# Patient Record
Sex: Male | Born: 1970 | Race: Black or African American | Hispanic: No | Marital: Single | Smoking: Never smoker
Health system: Southern US, Community
[De-identification: ages and names within clinical notes are randomized; demographics above are authoritative.]

## PROBLEM LIST (undated history)

## (undated) DIAGNOSIS — I10 Essential (primary) hypertension: Secondary | ICD-10-CM

## (undated) DIAGNOSIS — R7303 Prediabetes: Secondary | ICD-10-CM

## (undated) DIAGNOSIS — E785 Hyperlipidemia, unspecified: Secondary | ICD-10-CM

## (undated) DIAGNOSIS — E119 Type 2 diabetes mellitus without complications: Secondary | ICD-10-CM

## (undated) DIAGNOSIS — G709 Myoneural disorder, unspecified: Secondary | ICD-10-CM

## (undated) HISTORY — PX: WISDOM TOOTH EXTRACTION: SHX21

## (undated) HISTORY — DX: Essential (primary) hypertension: I10

## (undated) HISTORY — DX: Hyperlipidemia, unspecified: E78.5

## (undated) HISTORY — DX: Myoneural disorder, unspecified: G70.9

## (undated) HISTORY — DX: Type 2 diabetes mellitus without complications: E11.9

---

## 1997-07-13 ENCOUNTER — Emergency Department (HOSPITAL_COMMUNITY): Admission: EM | Admit: 1997-07-13 | Discharge: 1997-07-13 | Payer: Self-pay | Admitting: Emergency Medicine

## 2005-01-04 ENCOUNTER — Emergency Department (HOSPITAL_COMMUNITY): Admission: EM | Admit: 2005-01-04 | Discharge: 2005-01-04 | Payer: Self-pay | Admitting: Emergency Medicine

## 2006-05-26 ENCOUNTER — Emergency Department (HOSPITAL_COMMUNITY): Admission: EM | Admit: 2006-05-26 | Discharge: 2006-05-26 | Payer: Self-pay | Admitting: Emergency Medicine

## 2006-12-13 ENCOUNTER — Emergency Department (HOSPITAL_COMMUNITY): Admission: EM | Admit: 2006-12-13 | Discharge: 2006-12-13 | Payer: Self-pay | Admitting: Emergency Medicine

## 2007-10-03 ENCOUNTER — Emergency Department (HOSPITAL_COMMUNITY): Admission: EM | Admit: 2007-10-03 | Discharge: 2007-10-04 | Payer: Self-pay | Admitting: Emergency Medicine

## 2007-10-10 ENCOUNTER — Ambulatory Visit: Payer: Self-pay | Admitting: Nurse Practitioner

## 2007-10-10 DIAGNOSIS — I10 Essential (primary) hypertension: Secondary | ICD-10-CM | POA: Insufficient documentation

## 2007-10-15 ENCOUNTER — Encounter (INDEPENDENT_AMBULATORY_CARE_PROVIDER_SITE_OTHER): Payer: Self-pay | Admitting: Nurse Practitioner

## 2007-10-15 DIAGNOSIS — E7800 Pure hypercholesterolemia, unspecified: Secondary | ICD-10-CM | POA: Insufficient documentation

## 2007-10-24 ENCOUNTER — Ambulatory Visit: Payer: Self-pay | Admitting: Nurse Practitioner

## 2007-12-25 ENCOUNTER — Ambulatory Visit: Payer: Self-pay | Admitting: Nurse Practitioner

## 2007-12-25 DIAGNOSIS — R519 Headache, unspecified: Secondary | ICD-10-CM | POA: Insufficient documentation

## 2007-12-25 DIAGNOSIS — R0989 Other specified symptoms and signs involving the circulatory and respiratory systems: Secondary | ICD-10-CM

## 2007-12-25 DIAGNOSIS — R51 Headache: Secondary | ICD-10-CM | POA: Insufficient documentation

## 2007-12-25 DIAGNOSIS — R0609 Other forms of dyspnea: Secondary | ICD-10-CM | POA: Insufficient documentation

## 2008-02-03 ENCOUNTER — Encounter (INDEPENDENT_AMBULATORY_CARE_PROVIDER_SITE_OTHER): Payer: Self-pay | Admitting: Nurse Practitioner

## 2010-02-05 LAB — CONVERTED CEMR LAB
ALT: 40 units/L (ref 0–53)
AST: 29 units/L (ref 0–37)
Albumin: 4.4 g/dL (ref 3.5–5.2)
Alkaline Phosphatase: 62 units/L (ref 39–117)
BUN: 13 mg/dL (ref 6–23)
Basophils Absolute: 0 10*3/uL (ref 0.0–0.1)
Basophils Relative: 0 % (ref 0–1)
Bilirubin Urine: NEGATIVE
Blood in Urine, dipstick: NEGATIVE
CO2: 21 meq/L (ref 19–32)
Calcium: 9.6 mg/dL (ref 8.4–10.5)
Chloride: 107 meq/L (ref 96–112)
Cholesterol: 198 mg/dL (ref 0–200)
Creatinine, Ser: 1.08 mg/dL (ref 0.40–1.50)
Eosinophils Absolute: 0.1 10*3/uL (ref 0.0–0.7)
Eosinophils Relative: 3 % (ref 0–5)
Glucose, Bld: 92 mg/dL (ref 70–99)
Glucose, Urine, Semiquant: NEGATIVE
HCT: 49.2 % (ref 39.0–52.0)
HDL: 45 mg/dL (ref >39)
Hemoglobin: 15.5 g/dL (ref 13.0–17.0)
LDL Cholesterol: 115 mg/dL — ABNORMAL HIGH (ref 0–99)
Lymphocytes Relative: 31 % (ref 12–46)
Lymphs Abs: 1.6 10*3/uL (ref 0.7–4.0)
MCHC: 31.5 g/dL (ref 30.0–36.0)
MCV: 88.5 fL (ref 78.0–100.0)
Microalb, Ur: 0.51 mg/dL (ref 0.00–1.89)
Monocytes Absolute: 0.5 10*3/uL (ref 0.1–1.0)
Monocytes Relative: 9 % (ref 3–12)
Neutro Abs: 3.1 10*3/uL (ref 1.7–7.7)
Neutrophils Relative %: 58 % (ref 43–77)
Nitrite: NEGATIVE
Platelets: 248 10*3/uL (ref 150–400)
Potassium: 4.6 meq/L (ref 3.5–5.3)
Protein, U semiquant: NEGATIVE
RBC: 5.56 M/uL (ref 4.22–5.81)
RDW: 14.9 % (ref 11.5–15.5)
Sodium: 139 meq/L (ref 135–145)
Specific Gravity, Urine: 1.025
TSH: 1.218 microintl units/mL (ref 0.350–4.50)
Total Bilirubin: 0.6 mg/dL (ref 0.3–1.2)
Total CHOL/HDL Ratio: 4.4
Total Protein: 7.6 g/dL (ref 6.0–8.3)
Triglycerides: 191 mg/dL — ABNORMAL HIGH (ref <150)
Urobilinogen, UA: 0.2
VLDL: 38 mg/dL (ref 0–40)
WBC Urine, dipstick: NEGATIVE
WBC: 5.3 10*3/uL (ref 4.0–10.5)
pH: 5

## 2010-02-07 NOTE — Assessment & Plan Note (Signed)
Summary: HTN/Headache   Vital Signs:  Patient Profile:   40 Years Old Male Height:     71.65 inches (182 cm) Weight:      238 pounds BMI:     32.71 BSA:     2.29 Temp:     97.1 degrees F oral Pulse rate:   78 / minute Pulse rhythm:   regular Resp:     18 per minute BP sitting:   130 / 90  (right arm) Cuff size:   large  Pt. in pain?   no  Vitals Entered By: [place] Shannon MA              Is Patient Diabetic? No     Chief Complaint:  pt stated that he feels if his med is not working...pt says he is still having headaches and he has tried to change his diet and he is also exercising now...  History of Present Illness:  Pt into the office for follow up. Pt reports that he has daily headache. He was started on blood pressure medications back in 10/09. he is taking the blood pressure meds. +headache (upon waking at times) +snoring Exercise routine is lacking some.  He has not exercised in about 1 month. Drinks plenty of water.  Pt takes Tylenol ES or either Excedrin Migraine as needed for headache.  Usually takes tylenol ES.  Social - pt will start school in January of [employer]. He wants a career change.  labs reviewed from previous visit     Prior Medications Reviewed Using: Patient Recall  Current Allergies (reviewed today): No known allergies     Risk Factors: Tobacco use:  never Alcohol use:  yes    Type:  beer Exercise:  yes    Times per week:  3    Type:  walking Seatbelt use:  75 %   Review of Systems  Resp      Denies cough.  GI      Denies abdominal pain, nausea, and vomiting.  GU      Denies discharge.  Neuro      Complains of headaches.   Physical Exam  General:     alert.   Head:     normocephalic.  bald head Mouth:     fair dentation Lungs:     normal breath sounds.   Heart:     normal rate and regular rhythm.   Abdomen:     soft and non-tender.   Msk:     normal ROM.   Neurologic:     alert & oriented X3.     Psych:     Oriented X3.      Impression & Recommendations:  Problem # 1:  HYPERTENSION, BENIGN ESSENTIAL (ICD-401.1) will increase blood pressure meds.  His updated medication list for this problem includes:    Zestoretic 20-25 Mg Tabs (Lisinopril-hydrochlorothiazide) .Marland Kitchen... 1 tablet by mouth daily for blood pressure  Orders: Split Night (Split Night)   Problem # 2:  HEADACHE (ICD-784.0) will order sleep study. Orders: Split Night (Split Night)   Complete Medication List: 1)  Zestoretic 20-25 Mg Tabs (Lisinopril-hydrochlorothiazide) .Marland Kitchen.. 1 tablet by mouth daily for blood pressure   Patient Instructions: 1)  You will need to increase the blood pressure medications.  You will get need to get the new prescription.   2)  Sleep study will be done to see if you have sleep apnea. 3)  Follow up in 2-3 weeks for blood pressure check.  Prescriptions: ZESTORETIC 20-25 MG TABS (LISINOPRIL-HYDROCHLOROTHIAZIDE) 1 tablet by mouth daily for blood pressure  #30 x 3   Entered and Authorized by:   Lehman Prom FNP   Signed by:   Lehman Prom FNP on 12/25/2007   Method used:   Printed then faxed to ...       [facility] [address] [phone]* (retail)       [address]       [place], [place]  [address]       Ph: [phone]       Fax: [phone]   RxID:   [id number] ZESTORETIC 20-25 MG TABS (LISINOPRIL-HYDROCHLOROTHIAZIDE) 1 tablet by mouth daily for blood pressure  #30 x 3   Entered and Authorized by:   Lehman Prom FNP   Signed by:   Lehman Prom FNP on 12/25/2007   Method used:   Printed then faxed to ...       [facility] [address] [phone]* (retail)       [address]       [place], [place]  [address]       Ph: [phone]       Fax: [phone]   RxID:   [id number]  ]

## 2010-02-07 NOTE — Letter (Signed)
Summary: Lipid Letter  [facility]  [address] [place], [place] [address]   Phone: [phone]  Fax: [phone]    10/15/2007  [facility] [address] [place], [place]  [address]  Dear Henry Young:  We have carefully reviewed your last lipid profile from 10/10/2007 and the results are noted below with a summary of recommendations for lipid management.    Cholesterol:       198     Goal: less than 200   HDL "good" Cholesterol:   45     Goal: greater than 40   LDL "bad" Cholesterol:   115     Goal: less than 100   Triglycerides:       191     Goal: less than 150  Your cholesterol was slightly elevated during recent labs.  See attached handout on ways to modify your diet to improve cholesterol.  You will need your cholesterol re-checked in 3 months.    Adjunctive Measures (may lower LIPIDS and reduce risk of Heart Attack) include: Aerobic Exercise (20-30 minutes 3-4 times a week) Limit Alcohol Consumption Weight Reduction Aspirin 81 mg a day by mouth (if not allergic or contraindicated)     Current Medications: 1)    Lisinopril-hydrochlorothiazide 10-12.5 Mg Tabs (Lisinopril-hydrochlorothiazide) .Marland Kitchen.. 1 tablet by mouth for blood pressure  If you have any questions, please call. We appreciate being able to work with you.   Sincerely,    [facility]

## 2010-02-07 NOTE — Letter (Signed)
Summary: Handout Printed  Printed Handout:  - Diet - Low-Fat, Low-Saturated-Fat, Low-Cholesterol Diets 

## 2010-02-07 NOTE — Assessment & Plan Note (Signed)
Summary: Blood Pressure Check  Nurse Visit   Impression & Recommendations:  Problem # 1:  HYPERTENSION, BENIGN ESSENTIAL (ICD-401.1) Continue medications at current dose. He needs to take meds daily His updated medication list for this problem includes:    Lisinopril-hydrochlorothiazide 10-12.5 Mg Tabs (Lisinopril-hydrochlorothiazide) .Marland Kitchen... 1 tablet by mouth for blood pressure   Complete Medication List: 1)  Lisinopril-hydrochlorothiazide 10-12.5 Mg Tabs (Lisinopril-hydrochlorothiazide) .Marland Kitchen.. 1 tablet by mouth for blood pressure   Vital Signs:  Patient Profile:   40 Years Old Male CC:      BP chek Height:     71.65 inches (182 cm) BP sitting:   130 / 92  (left arm) Cuff size:   large  Vitals Entered By: Levon Hedger (October 24, 2007 2:17 PM)             Comments pt states he is not taking his medications everyday.  He has been having headaches alot.  Per Zena Amos he will continue medication at present dose and to take his medications daily and at the same time.  The headaches come from his pressure fluctuating up and down.     Prior Medications: LISINOPRIL-HYDROCHLOROTHIAZIDE 10-12.5 MG TABS (LISINOPRIL-HYDROCHLOROTHIAZIDE) 1 tablet by mouth for blood pressure Current Allergies: No known allergies     Orders Added: 1)  Est. Patient Level I [email or web address]    ]

## 2010-02-07 NOTE — Assessment & Plan Note (Signed)
Summary: NEW - HTN   Vital Signs:  Patient Profile:   40 Years Old Male Height:     71.65 inches (182 cm) Weight:      238 pounds Temp:     98.4 degrees F oral Pulse rate:   80 / minute Pulse rhythm:   regular Resp:     20 per minute BP sitting:   150 / 110  (left arm) Cuff size:   large  Pt. in pain?   no              Is Patient Diabetic? No     Chief Complaint:  pt is having headache and he thought his bp may be up or cholesterol.  History of Present Illness:  Pt into the office to establish care.  No PMH No PSH FH - mother  Pt started having headaches about 2 weeks ago. He went to [facility] and his BP was elevated.  He then went to the ER. He was told his blood pressure was elevated but he did not recieve any BP meds.    Pt does eat out all the time.    Hypertension History:      He complains of headache, but denies chest pain and palpitations.  He notes no problems with any antihypertensive medication side effects.        Positive major cardiovascular risk factors include hypertension.  Negative major cardiovascular risk factors include male age less than 85 years old and non-tobacco-user status.       Current Allergies: No known allergies    Family History:    htn - mother   Risk Factors:  Tobacco use:  never Alcohol use:  yes    Type:  beer Exercise:  yes    Times per week:  3    Type:  walking Seatbelt use:  75 %   Review of Systems  CV      Denies chest pain or discomfort.  Resp      Denies cough.  GI      Denies abdominal pain, nausea, and vomiting.  Neuro      Complains of headaches.   Serial Vital Signs/Assessments:  Time      Position  BP       Pulse  Resp  Temp     By                     128/90                         Lehman Prom FNP   Physical Exam  General:     alert.   Head:     normocephalic.   Eyes:     pupils round.   Lungs:     normal breath sounds.   Heart:     normal rate and regular rhythm.     Abdomen:     soft, non-tender, and normal bowel sounds.   Msk:     up to exam table no limits Neurologic:     alert & oriented X3.   Psych:     Oriented X3.      Impression & Recommendations:  Problem # 1:  HYPERTENSION, BENIGN ESSENTIAL (ICD-401.1) new onset need to start meds will check urine for micoralbumin handout given DASH diet His updated medication list for this problem includes:    Lisinopril-hydrochlorothiazide 10-12.5 Mg Tabs (Lisinopril-hydrochlorothiazide) .Marland Kitchen... 1 tablet by mouth for blood pressure  Orders: UA Dipstick w/o Micro (manual) (04540) T-General Health Panel (CBCD, CMP, TSH) ([address]) T-Lipid Profile ([address]562-13086) T-Urine Microalbumin w/creat. ratio (57846 / [address]) EKG w/ Interpretation (93000)   Complete Medication List: 1)  Lisinopril-hydrochlorothiazide 10-12.5 Mg Tabs (Lisinopril-hydrochlorothiazide) .Marland Kitchen.. 1 tablet by mouth for blood pressure  Hypertension Assessment/Plan:      The patient's hypertensive risk group is category A: No risk factors and no target organ damage.  Today's blood pressure is 150/110.  His blood pressure goal is < 140/90.   Patient Instructions: 1)  You will need to get scheduled for the flu vaccine.  You are indicated due to high blood pressure. 2)  Start blood pressure medications and take daily. 3)  You will be notified of any abnormal labs. 4)  You will need to come back for blood pressure check in 2 weeks (nurse visit)   Prescriptions: LISINOPRIL-HYDROCHLOROTHIAZIDE 10-12.5 MG TABS (LISINOPRIL-HYDROCHLOROTHIAZIDE) 1 tablet by mouth for blood pressure  #30 x 1   Entered and Authorized by:   Lehman Prom FNP   Signed by:   Lehman Prom FNP on 10/10/2007   Method used:   Print then Give to Patient   RxID:   [id number]  ] Laboratory Results   Urine Tests  Date/Time Recieved: October 10, 2007 12:01 PM   Routine Urinalysis   Color: lt. yellow Appearance: Clear Glucose: negative    (Normal Range: Negative) Bilirubin: negative   (Normal Range: Negative) Ketone: trace (5)   (Normal Range: Negative) Spec. Gravity: 1.025   (Normal Range: 1.003-1.035) Blood: negative   (Normal Range: Negative) pH: 5.0   (Normal Range: 5.0-8.0) Protein: negative   (Normal Range: Negative) Urobilinogen: 0.2   (Normal Range: 0-1) Nitrite: negative   (Normal Range: Negative) Leukocyte Esterace: negative   (Normal Range: Negative)        Appended Document: NEW - HTN        Current Allergies: No known allergies         Complete Medication List: 1)  Lisinopril-hydrochlorothiazide 10-12.5 Mg Tabs (Lisinopril-hydrochlorothiazide) .Marland Kitchen.. 1 tablet by mouth for blood pressure    ]  Medication Administration  Medication # 1:    Medication: Clonidine 0.1mg  tab    Diagnosis: HYPERTENSION, BENIGN ESSENTIAL (ICD-401.1)    Dose: 1 tablet    Route: po    Exp Date: 08/2008    Lot #: [id number]    Mfr: Stacie [place]    Comments: ndc [id number]    Patient tolerated medication without complications    Given by: Levon Hedger (October 10, 2007 12:46 PM)  Orders Added: 1)  Clonidine 0.1mg  tab [EMRORAL]

## 2011-04-12 ENCOUNTER — Ambulatory Visit: Payer: Self-pay | Admitting: Family Medicine

## 2011-04-12 VITALS — BP 137/84 | HR 108 | Temp 98.3°F | Resp 16 | Ht 72.0 in | Wt 232.0 lb

## 2011-04-12 DIAGNOSIS — R51 Headache: Secondary | ICD-10-CM

## 2011-04-12 MED ORDER — METOPROLOL SUCCINATE ER 50 MG PO TB24: 50.0000 mg | ORAL_TABLET | Freq: Every day | ORAL | Status: DC

## 2011-04-12 MED ORDER — KETOROLAC TROMETHAMINE 60 MG/2ML IM SOLN
60.0000 mg | Freq: Once | INTRAMUSCULAR | Status: AC
  Administered 2011-04-12: 60 mg via INTRAMUSCULAR

## 2011-04-12 NOTE — Progress Notes (Signed)
  Subjective:    Patient ID: Henry Young, male    DOB: 01-14-1970, 41 y.o.   MRN: [id number]  HPI 41 yo male with history of headaches here with headache for 3 days.  Behind his eyes, nagging, pressure.  Ibuprofen hasn't helped.  Did take old BP pills - one yesterday and one today.  No nausea.  No photo or phonophobia.    Was on blood pressure pills and stopped them 8 months ago.  Was put on because when he was evaluated for headaches previously BP was high and so he was started on them.  Stopped them because headaches improved.  Has tried to eat a little better, less.  Doesn't monitor BP outside of office.  Was with [facility] when he was started on the bp pills.     Review of Systems Negative except as per HPI     Objective:   Physical Exam  Constitutional: Vital signs are normal. He appears well-developed and well-nourished. He is active and cooperative.  HENT:  Head: Normocephalic.  Right Ear: Hearing, tympanic membrane, external ear and ear canal normal.  Left Ear: Hearing, tympanic membrane, external ear and ear canal normal.  Nose: Nose normal.  Mouth/Throat: Uvula is midline, oropharynx is clear and moist and mucous membranes are normal.  Eyes: Conjunctivae, EOM and lids are normal. Pupils are equal, round, and reactive to light. Right eye exhibits no nystagmus. Left eye exhibits no nystagmus.  Cardiovascular: Normal rate, regular rhythm, normal heart sounds, intact distal pulses and normal pulses.   Pulmonary/Chest: Effort normal and breath sounds normal.  Neurological: He is alert. He has normal strength. No cranial nerve deficit. He displays a negative Romberg sign. Coordination and gait normal.          Assessment & Plan:  Headache HTN  Toradol here  Start metoprolol ER 50 daily.

## 2011-04-12 NOTE — Progress Notes (Signed)
Addended by: Areta Haber B on: 04/12/2011 08:31 PM   Modules accepted: Orders

## 2016-11-15 DIAGNOSIS — I10 Essential (primary) hypertension: Secondary | ICD-10-CM | POA: Diagnosis not present

## 2016-11-15 DIAGNOSIS — E669 Obesity, unspecified: Secondary | ICD-10-CM | POA: Diagnosis not present

## 2016-12-13 DIAGNOSIS — I10 Essential (primary) hypertension: Secondary | ICD-10-CM | POA: Diagnosis not present

## 2017-03-14 DIAGNOSIS — E7800 Pure hypercholesterolemia, unspecified: Secondary | ICD-10-CM | POA: Diagnosis not present

## 2017-03-14 DIAGNOSIS — E669 Obesity, unspecified: Secondary | ICD-10-CM | POA: Diagnosis not present

## 2017-03-14 DIAGNOSIS — R7303 Prediabetes: Secondary | ICD-10-CM | POA: Diagnosis not present

## 2017-03-14 DIAGNOSIS — I10 Essential (primary) hypertension: Secondary | ICD-10-CM | POA: Diagnosis not present

## 2017-03-15 DIAGNOSIS — I10 Essential (primary) hypertension: Secondary | ICD-10-CM | POA: Diagnosis not present

## 2017-03-15 DIAGNOSIS — E7800 Pure hypercholesterolemia, unspecified: Secondary | ICD-10-CM | POA: Diagnosis not present

## 2018-02-05 ENCOUNTER — Ambulatory Visit (INDEPENDENT_AMBULATORY_CARE_PROVIDER_SITE_OTHER): Payer: BLUE CROSS/BLUE SHIELD

## 2018-02-05 ENCOUNTER — Ambulatory Visit: Payer: BLUE CROSS/BLUE SHIELD | Admitting: Podiatry

## 2018-02-05 ENCOUNTER — Encounter: Payer: Self-pay | Admitting: Podiatry

## 2018-02-05 VITALS — BP 161/92 | HR 92

## 2018-02-05 DIAGNOSIS — M722 Plantar fascial fibromatosis: Secondary | ICD-10-CM

## 2018-02-05 MED ORDER — METHYLPREDNISOLONE 4 MG PO TBPK
ORAL_TABLET | ORAL | 0 refills | Status: DC
Start: 2018-02-05 — End: 2018-03-06

## 2018-02-05 MED ORDER — MELOXICAM 15 MG PO TABS: 15.0000 mg | ORAL_TABLET | Freq: Every day | ORAL | 1 refills | Status: AC

## 2018-02-06 ENCOUNTER — Other Ambulatory Visit (HOSPITAL_COMMUNITY)
Admission: RE | Admit: 2018-02-06 | Discharge: 2018-02-06 | Disposition: A | Discharge status: Home / Self Care | Payer: BLUE CROSS/BLUE SHIELD | Source: Ambulatory Visit | Attending: Family Medicine | Admitting: Family Medicine

## 2018-02-06 ENCOUNTER — Ambulatory Visit (INDEPENDENT_AMBULATORY_CARE_PROVIDER_SITE_OTHER): Payer: BLUE CROSS/BLUE SHIELD | Admitting: Family Medicine

## 2018-02-06 ENCOUNTER — Encounter: Payer: Self-pay | Admitting: Family Medicine

## 2018-02-06 VITALS — BP 132/80 | HR 79 | Temp 97.7°F | Ht 72.0 in | Wt 240.2 lb

## 2018-02-06 DIAGNOSIS — Z13220 Encounter for screening for lipoid disorders: Secondary | ICD-10-CM | POA: Diagnosis not present

## 2018-02-06 DIAGNOSIS — Z6832 Body mass index (BMI) 32.0-32.9, adult: Secondary | ICD-10-CM | POA: Diagnosis not present

## 2018-02-06 DIAGNOSIS — Z125 Encounter for screening for malignant neoplasm of prostate: Secondary | ICD-10-CM

## 2018-02-06 DIAGNOSIS — R739 Hyperglycemia, unspecified: Secondary | ICD-10-CM

## 2018-02-06 DIAGNOSIS — I10 Essential (primary) hypertension: Secondary | ICD-10-CM | POA: Diagnosis not present

## 2018-02-06 DIAGNOSIS — Z114 Encounter for screening for human immunodeficiency virus [HIV]: Secondary | ICD-10-CM

## 2018-02-06 DIAGNOSIS — Z113 Encounter for screening for infections with a predominantly sexual mode of transmission: Secondary | ICD-10-CM | POA: Insufficient documentation

## 2018-02-06 DIAGNOSIS — Z0001 Encounter for general adult medical examination with abnormal findings: Secondary | ICD-10-CM

## 2018-02-06 NOTE — Assessment & Plan Note (Signed)
At goal.  Continue losartan 100 mg daily.  Check CBC, C met, and TSH.  Discussed importance of healthy diet and regular exercise.  Continue home blood pressure monitoring with goal 140/90 or lower. Follow up in 1 year or sooner if BP not at goal.

## 2018-02-06 NOTE — Progress Notes (Signed)
Subjective:  Henry Young is a 48 y.o. male who presents today for his annual comprehensive physical exam and to establish care.   HPI:  He has no acute complaints today.  He would like to be screened for STDs.  He has no current symptoms of any STDs.  His stable, chronic medical conditions are outlined below:  # Essential Hypertension - On losartan '100mg'$  daily and tolerating well - ROS: No chest pain or shortness of breath.   Lifestyle Diet: No specific diet.  Exercise: Very active at work. Not going to gym.   Depression screen PHQ 2/9 02/06/2018  Decreased Interest 0  Down, Depressed, Hopeless 0  PHQ - 2 Score 0    Health Maintenance Due  Topic Date Due  . HIV Screening  01/11/1985     ROS: Pr HPI, otherwise a complete review of systems was negative.   PMH:  The following were reviewed and entered/updated in epic: Past Medical History:  Diagnosis Date  . Hypertension    Patient Active Problem List   Diagnosis Date Noted  . Essential hypertension 02/06/2018   History reviewed. No pertinent surgical history.  Family History  Problem Relation Age of Onset  . Heart disease Mother   . Prostate cancer Neg Hx   . Colon cancer Neg Hx     Medications- reviewed and updated Current Outpatient Medications  Medication Sig Dispense Refill  . losartan (COZAAR) 100 MG tablet Take by mouth.    . meloxicam (MOBIC) 15 MG tablet Take 1 tablet (15 mg total) by mouth daily for 30 days. 60 tablet 1  . methylPREDNISolone (MEDROL DOSEPAK) 4 MG TBPK tablet 6 day dose pack - take as directed 21 tablet 0   No current facility-administered medications for this visit.     Allergies-reviewed and updated No Known Allergies  Social History   Socioeconomic History  . Marital status: Single    Spouse name: Not on file  . Number of children: Not on file  . Years of education: Not on file  . Highest education level: Not on file  Occupational History  . Not on file    Social Needs  . Financial resource strain: Not on file  . Food insecurity:    Worry: Not on file    Inability: Not on file  . Transportation needs:    Medical: Not on file    Non-medical: Not on file  Tobacco Use  . Smoking status: Never Smoker  . Smokeless tobacco: Never Used  Substance and Sexual Activity  . Alcohol use: Yes  . Drug use: Not on file  . Sexual activity: Yes  Lifestyle  . Physical activity:    Days per week: Not on file    Minutes per session: Not on file  . Stress: Not on file  Relationships  . Social connections:    Talks on phone: Not on file    Gets together: Not on file    Attends religious service: Not on file    Active member of club or organization: Not on file    Attends meetings of clubs or organizations: Not on file    Relationship status: Not on file  Other Topics Concern  . Not on file  Social History Narrative  . Not on file    Objective:  Physical Exam: BP 132/80 (BP Location: Left Arm, Patient Position: Sitting, Cuff Size: Large)   Pulse 79   Temp 97.7 F (36.5 C) (Oral)   Ht 6' (  1.829 m)   Wt 240 lb 4 oz (109 kg)   SpO2 98%   BMI 32.58 kg/m   Body mass index is 32.58 kg/m. Wt Readings from Last 3 Encounters:  02/06/18 240 lb 4 oz (109 kg)  04/12/11 232 lb (105.2 kg)   Gen: NAD, resting comfortably HEENT: TMs normal bilaterally. OP clear. No thyromegaly noted.  CV: RRR with no murmurs appreciated Pulm: NWOB, CTAB with no crackles, wheezes, or rhonchi GI: Normal bowel sounds present. Soft, Nontender, Nondistended. MSK: no edema, cyanosis, or clubbing noted Skin: warm, dry Neuro: CN2-12 grossly intact. Strength 5/5 in upper and lower extremities. Reflexes symmetric and intact bilaterally.  Psych: Normal affect and thought content  Assessment/Plan:  Essential hypertension At goal.  Continue losartan 100 mg daily.  Check CBC, C met, and TSH.  Discussed importance of healthy diet and regular exercise.  Continue home blood  pressure monitoring with goal 140/90 or lower. Follow up in 1 year or sooner if BP not at goal.   BMI 32 Discussed lifestyle modifications.  Recommended focusing on cardiovascular exercise and limiting carb intake.  STD Screening Check urine gonorrhea, chlamydia, trichomonas.  Check HIV antibody.  Check RPR.  Preventative Healthcare: Check lipid panel, HIV antibody, and PSA.  Screen for via A1c.  Patient Counseling(The following topics were reviewed and/or handout was given):  -Nutrition: Stressed importance of moderation in sodium/caffeine intake, saturated fat and cholesterol, caloric balance, sufficient intake of fresh fruits, vegetables, and fiber.  -Stressed the importance of regular exercise.   -Substance Abuse: Discussed cessation/primary prevention of tobacco, alcohol, or other drug use; driving or other dangerous activities under the influence; availability of treatment for abuse.   -Injury prevention: Discussed safety belts, safety helmets, smoke detector, smoking near bedding or upholstery.   -Sexuality: Discussed sexually transmitted diseases, partner selection, use of condoms, avoidance of unintended pregnancy and contraceptive alternatives.   -Dental health: Discussed importance of regular tooth brushing, flossing, and dental visits.  -Health maintenance and immunizations reviewed. Please refer to Health maintenance section.  Return to care in 1 year for next preventative visit.   Algis Greenhouse. [name], MD 02/06/2018 4:31 PM

## 2018-02-06 NOTE — Patient Instructions (Addendum)
It was very nice to see you today!  Please work on increasing your cardiovascular exercise to at least 15 to 30 minutes daily.  Work on cutting down on carbs.  We will check blood work today and a urine sample.  Please come back to see me in 1 year or sooner as needed.  Take care, Dr [name]   Preventive Care 40-64 Years, Male Preventive care refers to lifestyle choices and visits with your health care provider that can promote health and wellness. What does preventive care include?   A yearly physical exam. This is also called an annual well check.  Dental exams once or twice a year.  Routine eye exams. Ask your health care provider how often you should have your eyes checked.  Personal lifestyle choices, including: ? Daily care of your teeth and gums. ? Regular physical activity. ? Eating a healthy diet. ? Avoiding tobacco and drug use. ? Limiting alcohol use. ? Practicing safe sex. ? Taking low-dose aspirin every day starting at age 36. What happens during an annual well check? The services and screenings done by your health care provider during your annual well check will depend on your age, overall health, lifestyle risk factors, and family history of disease. Counseling Your health care provider may ask you questions about your:  Alcohol use.  Tobacco use.  Drug use.  Emotional well-being.  Home and relationship well-being.  Sexual activity.  Eating habits.  Work and work Statistician. Screening You may have the following tests or measurements:  Height, weight, and BMI.  Blood pressure.  Lipid and cholesterol levels. These may be checked every 5 years, or more frequently if you are over 27 years old.  Skin check.  Lung cancer screening. You may have this screening every year starting at age 63 if you have a 30-pack-year history of smoking and currently smoke or have quit within the past 15 years.  Colorectal cancer screening. All adults should  have this screening starting at age 43 and continuing until age 74. Your health care provider may recommend screening at age 34. You will have tests every 1-10 years, depending on your results and the type of screening test. People at increased risk should start screening at an earlier age. Screening tests may include: ? Guaiac-based fecal occult blood testing. ? Fecal immunochemical test (FIT). ? Stool DNA test. ? Virtual colonoscopy. ? Sigmoidoscopy. During this test, a flexible tube with a tiny camera (sigmoidoscope) is used to examine your rectum and lower colon. The sigmoidoscope is inserted through your anus into your rectum and lower colon. ? Colonoscopy. During this test, a long, thin, flexible tube with a tiny camera (colonoscope) is used to examine your entire colon and rectum.  Prostate cancer screening. Recommendations will vary depending on your family history and other risks.  Hepatitis C blood test.  Hepatitis B blood test.  Sexually transmitted disease (STD) testing.  Diabetes screening. This is done by checking your blood sugar (glucose) after you have not eaten for a while (fasting). You may have this done every 1-3 years. Discuss your test results, treatment options, and if necessary, the need for more tests with your health care provider. Vaccines Your health care provider may recommend certain vaccines, such as:  Influenza vaccine. This is recommended every year.  Tetanus, diphtheria, and acellular pertussis (Tdap, Td) vaccine. You may need a Td booster every 10 years.  Varicella vaccine. You may need this if you have not been vaccinated.  Zoster vaccine.  You may need this after age 53.  Measles, mumps, and rubella (MMR) vaccine. You may need at least one dose of MMR if you were born in 1957 or later. You may also need a second dose.  Pneumococcal 13-valent conjugate (PCV13) vaccine. You may need this if you have certain conditions and have not been  vaccinated.  Pneumococcal polysaccharide (PPSV23) vaccine. You may need one or two doses if you smoke cigarettes or if you have certain conditions.  Meningococcal vaccine. You may need this if you have certain conditions.  Hepatitis A vaccine. You may need this if you have certain conditions or if you travel or work in places where you may be exposed to hepatitis A.  Hepatitis B vaccine. You may need this if you have certain conditions or if you travel or work in places where you may be exposed to hepatitis B.  Haemophilus influenzae type b (Hib) vaccine. You may need this if you have certain risk factors. Talk to your health care provider about which screenings and vaccines you need and how often you need them. This information is not intended to replace advice given to you by your health care provider. Make sure you discuss any questions you have with your health care provider. Document Released: 01/21/2015 Document Revised: 02/14/2017 Document Reviewed: 10/26/2014 Elsevier Interactive Patient Education  2019 [employer].

## 2018-02-07 LAB — COMPREHENSIVE METABOLIC PANEL
ALT: 24 U/L (ref 0–53)
AST: 19 U/L (ref 0–37)
Albumin: 4.3 g/dL (ref 3.5–5.2)
Alkaline Phosphatase: 64 U/L (ref 39–117)
BUN: 17 mg/dL (ref 6–23)
CO2: 27 mEq/L (ref 19–32)
Calcium: 9.8 mg/dL (ref 8.4–10.5)
Chloride: 104 mEq/L (ref 96–112)
Creatinine, Ser: 1.07 mg/dL (ref 0.40–1.50)
GFR: 89.23 mL/min (ref >60.00)
Glucose, Bld: 108 mg/dL — ABNORMAL HIGH (ref 70–99)
Potassium: 4 mEq/L (ref 3.5–5.1)
Sodium: 141 mEq/L (ref 135–145)
Total Bilirubin: 0.6 mg/dL (ref 0.2–1.2)
Total Protein: 7.3 g/dL (ref 6.0–8.3)

## 2018-02-07 LAB — LIPID PANEL
Cholesterol: 213 mg/dL — ABNORMAL HIGH (ref 0–200)
HDL: 47.2 mg/dL (ref >39.00)
LDL Cholesterol: 128 mg/dL — ABNORMAL HIGH (ref 0–99)
NonHDL: 166.26
Total CHOL/HDL Ratio: 5
Triglycerides: 190 mg/dL — ABNORMAL HIGH (ref 0.0–149.0)
VLDL: 38 mg/dL (ref 0.0–40.0)

## 2018-02-07 LAB — CBC
HCT: 45.6 % (ref 39.0–52.0)
Hemoglobin: 14.8 g/dL (ref 13.0–17.0)
MCHC: 32.4 g/dL (ref 30.0–36.0)
MCV: 87.3 fl (ref 78.0–100.0)
Platelets: 265 10*3/uL (ref 150.0–400.0)
RBC: 5.22 Mil/uL (ref 4.22–5.81)
RDW: 15.2 % (ref 11.5–15.5)
WBC: 14.8 10*3/uL — ABNORMAL HIGH (ref 4.0–10.5)

## 2018-02-07 LAB — HEMOGLOBIN A1C: Hgb A1c MFr Bld: 6.1 % (ref 4.6–6.5)

## 2018-02-07 LAB — HIV ANTIBODY (ROUTINE TESTING W REFLEX): HIV 1&2 Ab, 4th Generation: NONREACTIVE

## 2018-02-07 LAB — RPR: RPR Ser Ql: NONREACTIVE

## 2018-02-07 LAB — PSA: PSA: 1.22 ng/mL (ref 0.10–4.00)

## 2018-02-07 LAB — TSH: TSH: 0.5 u[IU]/mL (ref 0.35–4.50)

## 2018-02-10 LAB — URINE CYTOLOGY ANCILLARY ONLY
Chlamydia: NEGATIVE
Neisseria Gonorrhea: NEGATIVE
Trichomonas: NEGATIVE

## 2018-02-11 ENCOUNTER — Encounter: Payer: Self-pay | Admitting: Family Medicine

## 2018-02-11 DIAGNOSIS — E785 Hyperlipidemia, unspecified: Secondary | ICD-10-CM | POA: Insufficient documentation

## 2018-02-11 DIAGNOSIS — R7303 Prediabetes: Secondary | ICD-10-CM | POA: Insufficient documentation

## 2018-02-11 NOTE — Progress Notes (Signed)
Please inform patient of the following:  His blood sugar is borderline diabetic range. Recommend starting metformin 750mg  daily to improve blood sugar and help with weight loss. Would like to recheck in 6-12 months.  His cholesterol is also mildly elevated.  Recommend starting Lipitor 40 mg daily to improve numbers and reduce risk of heart attack and stroke.  All of his other blood work and STD testing was normal.  Henry Young. [name], MD 02/11/2018 11:33 AM

## 2018-02-12 NOTE — Progress Notes (Signed)
   Subjective: 48 year old male presenting today as a new patient with a chief complaint of intermittent sharp, shooting pain to the posterior right heel that began 1-2 months ago. He reports associated throbbing pain at night. Being on the foot for long periods of time increases the pain. He has not done anything for treatment. Patient is here for further evaluation and treatment.   Past Medical History:  Diagnosis Date  . Hypertension      Objective: Physical Exam General: The patient is alert and oriented x3 in no acute distress.  Dermatology: Skin is warm, dry and supple bilateral lower extremities. Negative for open lesions or macerations bilateral.   Vascular: Dorsalis Pedis and Posterior Tibial pulses palpable bilateral.  Capillary fill time is immediate to all digits.  Neurological: Epicritic and protective threshold intact bilateral.   Musculoskeletal: Tenderness to palpation to the plantar aspect of the right heel along the plantar fascia. All other joints range of motion within normal limits bilateral. Strength 5/5 in all groups bilateral.   Radiographic exam: Normal osseous mineralization. Joint spaces preserved. No fracture/dislocation/boney destruction. No other soft tissue abnormalities or radiopaque foreign bodies.   Assessment: 1. Plantar fasciitis right  Plan of Care:  1. Patient evaluated. Xrays reviewed.   2. Injection of 0.5cc Celestone soluspan injected into the right plantar fascia  3. Rx for Medrol Dose Pack placed 4. Rx for Meloxicam ordered for patient. 4. Plantar fascial band(s) dispensed 5. Instructed patient regarding therapies and modalities at home to alleviate symptoms.  6. Appointment with Liliane Channel, Pedorthist, for custom molded orthotics.  7. Return to clinic in 4 weeks.    Works at [employer]    Edrick Kins, DPM [facility]  Dr. Edrick Kins, DPM    [address],  [place] [address]                Office [phone]  Fax [phone]

## 2018-02-13 ENCOUNTER — Other Ambulatory Visit: Payer: Self-pay

## 2018-02-13 MED ORDER — ATORVASTATIN CALCIUM 40 MG PO TABS: 40.0000 mg | ORAL_TABLET | Freq: Every day | ORAL | 1 refills | Status: DC

## 2018-02-13 MED ORDER — METFORMIN HCL ER 750 MG PO TB24: 750.0000 mg | ORAL_TABLET | Freq: Every day | ORAL | 1 refills | Status: DC

## 2018-02-17 ENCOUNTER — Other Ambulatory Visit: Payer: BLUE CROSS/BLUE SHIELD | Admitting: Orthotics

## 2018-02-27 ENCOUNTER — Ambulatory Visit: Payer: BLUE CROSS/BLUE SHIELD | Admitting: Orthotics

## 2018-02-27 DIAGNOSIS — M722 Plantar fascial fibromatosis: Secondary | ICD-10-CM

## 2018-02-27 NOTE — Progress Notes (Signed)

## 2018-03-03 ENCOUNTER — Other Ambulatory Visit: Payer: Self-pay

## 2018-03-03 ENCOUNTER — Emergency Department (HOSPITAL_COMMUNITY)
Admission: EM | Admit: 2018-03-03 | Discharge: 2018-03-03 | Disposition: A | Discharge status: Home / Self Care | Payer: BLUE CROSS/BLUE SHIELD | Attending: Emergency Medicine | Admitting: Emergency Medicine

## 2018-03-03 DIAGNOSIS — Z7984 Long term (current) use of oral hypoglycemic drugs: Secondary | ICD-10-CM | POA: Insufficient documentation

## 2018-03-03 DIAGNOSIS — I10 Essential (primary) hypertension: Secondary | ICD-10-CM | POA: Diagnosis not present

## 2018-03-03 DIAGNOSIS — M79661 Pain in right lower leg: Secondary | ICD-10-CM | POA: Diagnosis not present

## 2018-03-03 DIAGNOSIS — E119 Type 2 diabetes mellitus without complications: Secondary | ICD-10-CM | POA: Diagnosis not present

## 2018-03-03 MED ORDER — IBUPROFEN 800 MG PO TABS: 800.0000 mg | ORAL_TABLET | Freq: Three times a day (TID) | ORAL | 0 refills | Status: DC | PRN

## 2018-03-03 NOTE — ED Notes (Signed)
Patient given discharge teaching and verbalized understanding. Patient ambulated out of ED with a steady gait. 

## 2018-03-03 NOTE — Discharge Instructions (Addendum)
You were evaluated in the emergency department for sudden onset of right calf pain and a pop sensation.  This is likely a muscle tear.  You should use ice initially and then switch to heat.  Ibuprofen 4 tablets 3 times a day with food on your stomach.  Gentle stretching.  You will need to follow-up with your doctor and may end up needing to see an orthopedic surgeon or physical therapy.  Please return if any worsening symptoms.

## 2018-03-03 NOTE — ED Provider Notes (Signed)
[facility] DEPT Provider Note   CSN: [id number] Arrival date & time: 03/03/18  3662    History   Chief Complaint Chief Complaint  Patient presents with  . Leg Pain    HPI Henry Young is a 48 y.o. male.  He is complaining of acute onset of right calf pain this morning while lifting some roofing materials.  He said he heard a pop and then the pain occurred.  It is worse with ambulation and improves with rest.  No chest pain shortness of breath no numbness no weakness.  He is tried nothing for it.  He never had this before.     The history is provided by the patient.  Leg Pain  Location:  Leg Injury: no   Leg location:  R leg Pain details:    Quality:  Sharp, tearing and throbbing   Radiates to:  Does not radiate   Severity:  Moderate   Onset quality:  Sudden   Timing:  Constant   Progression:  Unchanged Chronicity:  New Prior injury to area:  No Relieved by:  None tried Worsened by:  Bearing weight, extension and activity Ineffective treatments:  None tried Associated symptoms: no back pain, no decreased ROM, no fever and no numbness     Past Medical History:  Diagnosis Date  . Hypertension     Patient Active Problem List   Diagnosis Date Noted  . Dyslipidemia 02/11/2018  . Prediabetes 02/11/2018  . Essential hypertension 02/06/2018    No past surgical history on file.      Home Medications    Prior to Admission medications   Medication Sig Start Date End Date Taking? Authorizing Provider  atorvastatin (LIPITOR) 40 MG tablet Take 1 tablet (40 mg total) by mouth daily. 02/13/18   Briscoe Deutscher, DO  losartan (COZAAR) 100 MG tablet Take by mouth. 01/15/18   [provider]  meloxicam (MOBIC) 15 MG tablet Take 1 tablet (15 mg total) by mouth daily for 30 days. 02/05/18 03/07/18  Edrick Kins, DPM  metFORMIN (GLUCOPHAGE-XR) 750 MG 24 hr tablet Take 1 tablet (750 mg total) by mouth daily with breakfast. 02/13/18    Vivi Barrack, MD  methylPREDNISolone (MEDROL DOSEPAK) 4 MG TBPK tablet 6 day dose pack - take as directed 02/05/18   Edrick Kins, DPM    Family History Family History  Problem Relation Age of Onset  . Heart disease Mother   . Prostate cancer Neg Hx   . Colon cancer Neg Hx     Social History Social History   Tobacco Use  . Smoking status: Never Smoker  . Smokeless tobacco: Never Used  Substance Use Topics  . Alcohol use: Yes  . Drug use: Not on file     Allergies   Patient has no known allergies.   Review of Systems Review of Systems  Constitutional: Negative for fever.  HENT: Negative for sore throat.   Respiratory: Negative for shortness of breath.   Cardiovascular: Negative for chest pain.  Gastrointestinal: Negative for abdominal pain.  Genitourinary: Negative for dysuria.  Musculoskeletal: Negative for back pain.  Skin: Negative for rash.     Physical Exam Updated Vital Signs BP (!) 156/106 (BP Location: Right Arm)   Pulse 77   Temp (!) 97.5 F (36.4 C) (Oral)   Resp 16   Ht 6' (1.829 m)   Wt 108.9 kg   SpO2 100%   BMI 32.55 kg/m   Physical Exam  Vitals signs and nursing note reviewed.  Constitutional:      Appearance: He is well-developed.  HENT:     Head: Normocephalic and atraumatic.  Eyes:     Conjunctiva/sclera: Conjunctivae normal.  Neck:     Musculoskeletal: Neck supple.  Pulmonary:     Effort: Pulmonary effort is normal.  Musculoskeletal:        General: Tenderness present.     Right lower leg: No edema.     Left lower leg: No edema.     Comments: Knee is full range of motion without any instability no tenderness.  Achilles is intact.  There is some tenderness in the middle to proximal posterior leg with no palpable defect.  Distal sensation and motor intact.  Skin:    General: Skin is warm and dry.     Capillary Refill: Capillary refill takes less than 2 seconds.  Neurological:     General: No focal deficit present.      Mental Status: He is alert.     GCS: GCS eye subscore is 4. GCS verbal subscore is 5. GCS motor subscore is 6.      ED Treatments / Results  Labs (all labs ordered are listed, but only abnormal results are displayed) Labs Reviewed - No data to display  EKG None  Radiology No results found.  Procedures Procedures (including critical care time)  Medications Ordered in ED Medications - No data to display   Initial Impression / Assessment and Plan / ED Course  I have reviewed the triage vital signs and the nursing notes.  Pertinent labs & imaging results that were available during my care of the patient were reviewed by me and considered in my medical decision making (see chart for details).      Clinically has calf pain likely muscular tear.  Doubt DVT with acute onset of symptoms while lifting.  No evidence of Achilles rupture.  No knee injury.  Will treat symptomatically and have him follow-up with his PCP.  Final Clinical Impressions(s) / ED Diagnoses   Final diagnoses:  Right calf pain    ED Discharge Orders         Ordered    ibuprofen (ADVIL,MOTRIN) 800 MG tablet  Every 8 hours PRN     03/03/18 0902           Hayden Rasmussen, MD 03/03/18 [phone]

## 2018-03-03 NOTE — ED Triage Notes (Signed)
Pt states he felt a "pop" in his right calf this morning. Pt states he thinks he strained a muscle. Ambulatory to room.

## 2018-03-03 NOTE — ED Notes (Addendum)
Patient ambulated to room 11 from ED lobby.

## 2018-03-05 ENCOUNTER — Encounter: Payer: BLUE CROSS/BLUE SHIELD | Admitting: Podiatry

## 2018-03-05 ENCOUNTER — Encounter: Payer: Self-pay | Admitting: Family Medicine

## 2018-03-06 ENCOUNTER — Ambulatory Visit: Payer: BLUE CROSS/BLUE SHIELD | Admitting: Family Medicine

## 2018-03-06 ENCOUNTER — Encounter: Payer: Self-pay | Admitting: Family Medicine

## 2018-03-06 VITALS — BP 130/78 | HR 74 | Temp 97.6°F | Ht 72.0 in | Wt 245.0 lb

## 2018-03-06 DIAGNOSIS — M25512 Pain in left shoulder: Secondary | ICD-10-CM

## 2018-03-06 DIAGNOSIS — R7303 Prediabetes: Secondary | ICD-10-CM

## 2018-03-06 MED ORDER — PREDNISONE 50 MG PO TABS: ORAL_TABLET | ORAL | 0 refills | Status: DC

## 2018-03-06 NOTE — Progress Notes (Signed)
   Chief Complaint:  Henry Young is a 48 y.o. male who presents for same day appointment with a chief complaint of shoulder pain.   Assessment/Plan:  Shoulder Pain Likely mild rotator cuff tendinopathy.  No red flags.  Will start prednisone 50 mg x 5 days.  Discussed home exercise program and handout was given.  Discussed reasons to return to care.  Follow-up as needed.  Prediabetes Patient declined starting metformin.  He will follow-up with me in 6 months to recheck A1c.     Subjective:  HPI:  Shoulder Pain, acute problem Started about a week ago.  Worsened over that time.  Located in left shoulder.  Worse with certain arm movements and neck movements.  He has been doing a lot of tugging and pulling at work and thinks he may have injured it there.  No weakness or numbness.  He has been trying to take ibuprofen with no significant improvement.  Tried using a heating pad which is helped a little bit.  No obvious alleviating or aggravating factors.  Prediabetes Patient never started his metformin.  Would like to try diet and exercise modifications before starting medications.    ROS: Per HPI  PMH: He reports that he has never smoked. He has never used smokeless tobacco. He reports current alcohol use. No history on file for drug.      Objective:  Physical Exam: BP 130/78 (BP Location: Left Arm, Patient Position: Sitting, Cuff Size: Normal)   Pulse 74   Temp 97.6 F (36.4 C) (Oral)   Ht 6' (1.829 m)   Wt 245 lb (111.1 kg)   SpO2 96%   BMI 33.23 kg/m   Wt Readings from Last 3 Encounters:  03/06/18 245 lb (111.1 kg)  03/03/18 240 lb (108.9 kg)  02/06/18 240 lb 4 oz (109 kg)  Gen: NAD, resting comfortably CV: Regular rate and rhythm with no murmurs appreciated Pulm: Normal work of breathing, clear to auscultation bilaterally with no crackles, wheezes, or rhonchi MSK: -Left shoulder: No deformities.  Tender to palpation along left anterior aspect of shoulder.  Full  range motion throughout.  Strength 5 out of 5 throughout.  Tenderness elicited with resisted internal rotation.  Supraspinatus test negative crossover test.  Negative O'Brien test.  Neurovascular intact distally.     Algis Greenhouse. [name], MD 03/06/2018 4:18 PM

## 2018-03-06 NOTE — Patient Instructions (Signed)
It was very nice to see you today!  I think you have strained your rotator cuff.  Please start the prednisone and work on the exercises.  Let me know if your symptoms do not improve in the next 5 to 7 days.  Take care, Dr [name]

## 2018-03-06 NOTE — Assessment & Plan Note (Signed)
Patient declined starting metformin.  He will follow-up with me in 6 months to recheck A1c.

## 2018-03-11 NOTE — Progress Notes (Signed)
This encounter was created in error - please disregard.

## 2018-03-12 ENCOUNTER — Encounter: Payer: BLUE CROSS/BLUE SHIELD | Admitting: Podiatry

## 2018-03-18 NOTE — Progress Notes (Signed)
This encounter was created in error - please disregard.

## 2018-03-19 ENCOUNTER — Telehealth: Payer: Self-pay | Admitting: Family Medicine

## 2018-03-19 ENCOUNTER — Other Ambulatory Visit: Payer: Self-pay

## 2018-03-19 MED ORDER — PREDNISONE 50 MG PO TABS: ORAL_TABLET | ORAL | 0 refills | Status: DC

## 2018-03-19 NOTE — Telephone Encounter (Signed)
See note

## 2018-03-19 NOTE — Telephone Encounter (Signed)
Copied from [place] [phone]. Topic: General - Other >> Mar 19, 2018 12:56 PM Lennox Solders wrote: Reason for CRM: pt is calling and losartan is on back order. This med was prescribed by his old doctor. Pt needs new bp medication. [facility] [address]

## 2018-03-19 NOTE — Telephone Encounter (Signed)
Please advise 

## 2018-03-19 NOTE — Telephone Encounter (Signed)
Copied from [place] [phone]. Topic: General - Other >> Mar 19, 2018 10:47 AM Lennox Solders wrote: Reason for CRM: pt is calling and he pick up prednisone and lost the medication. Pt did not taking any of prednisone. Pt would like refill on prednisone. [facility] [address]. Pt saw dr [name] on 03-06-2018

## 2018-03-19 NOTE — Telephone Encounter (Signed)
Rx sent to pharmacy   

## 2018-03-19 NOTE — Telephone Encounter (Signed)
Ok with me. Please place any necessary orders. 

## 2018-03-20 NOTE — Telephone Encounter (Signed)
Ok to send in olmesartan 40mg  daily.  Algis Greenhouse. [name], MD 03/20/2018 5:55 PM

## 2018-03-21 ENCOUNTER — Other Ambulatory Visit: Payer: Self-pay

## 2018-03-21 MED ORDER — OLMESARTAN MEDOXOMIL 40 MG PO TABS: 40.0000 mg | ORAL_TABLET | Freq: Every day | ORAL | 1 refills | Status: DC

## 2018-03-21 NOTE — Telephone Encounter (Signed)
See Dr. Marigene Ehlers note. Please send in to pharmacy.

## 2018-03-21 NOTE — Telephone Encounter (Signed)
Called into pharmacy

## 2018-03-24 ENCOUNTER — Ambulatory Visit: Payer: BLUE CROSS/BLUE SHIELD | Admitting: Sports Medicine

## 2018-03-24 ENCOUNTER — Other Ambulatory Visit: Payer: Self-pay

## 2018-03-24 ENCOUNTER — Encounter: Payer: Self-pay | Admitting: Sports Medicine

## 2018-03-24 ENCOUNTER — Ambulatory Visit: Payer: BLUE CROSS/BLUE SHIELD | Admitting: Family Medicine

## 2018-03-24 VITALS — BP 148/88 | HR 95 | Ht 72.0 in | Wt 239.8 lb

## 2018-03-24 DIAGNOSIS — M9908 Segmental and somatic dysfunction of rib cage: Secondary | ICD-10-CM

## 2018-03-24 DIAGNOSIS — M9901 Segmental and somatic dysfunction of cervical region: Secondary | ICD-10-CM

## 2018-03-24 DIAGNOSIS — M25512 Pain in left shoulder: Secondary | ICD-10-CM | POA: Diagnosis not present

## 2018-03-24 DIAGNOSIS — M9902 Segmental and somatic dysfunction of thoracic region: Secondary | ICD-10-CM

## 2018-03-24 DIAGNOSIS — G8929 Other chronic pain: Secondary | ICD-10-CM | POA: Diagnosis not present

## 2018-03-24 DIAGNOSIS — B001 Herpesviral vesicular dermatitis: Secondary | ICD-10-CM

## 2018-03-24 MED ORDER — VALACYCLOVIR HCL 500 MG PO TABS: 1000.0000 mg | ORAL_TABLET | Freq: Two times a day (BID) | ORAL | 0 refills | Status: AC

## 2018-03-24 NOTE — Patient Instructions (Addendum)
Please perform the exercise program that we have prepared for you and gone over in detail on a daily basis.  In addition to the handout you were provided you can access your program through: [email or web address]   Your unique program code is:  [facility]

## 2018-03-24 NOTE — Progress Notes (Signed)
Henry Young. Henry Young, Henry Young at [facility] [phone]  Henry Young - 48 y.o. male MRN [id number]  Date of birth: 1970/08/22  Visit Date: March 27, 2018  PCP: Vivi Barrack, MD   Referred by: Vivi Barrack, MD  SUBJECTIVE:  Chief Complaint  Patient presents with  . New Patient (Initial Visit)    L shoulder pain.  Tried Prednisone 50 mg x 5 days    HPI: Patient is here for initial evaluation of left shoulder pain.  This is been present for 2 to 3 weeks.  Does not recall any specific injury but does a lot of overhead lifting.  Pain is moderate and located across the left anterior aspect of the shoulder radiating to the left forearm but not into the fingers.  Full overhead motion and some changes in his neck position do seem to exacerbate this.  He is tried prednisone for 5 days and gentle stretching with no significant improvement.  REVIEW OF SYSTEMS: Night time disturbances: Reports issues with both sleep onset and sleep maintenance due to this. Fevers, chills and night sweats: Denies Unexplained weight loss: Denies Personal history of cancer: Denies Changes in bowel or bladder habits: Denies Recent unreported falls: Denies New or worsening dyspnea or wheezing: Denies Headaches and dizziness: Denies Numbness, tingling and weakness in the extremities: Denies Dizziness or presyncopal episodes: Denies Lower extremity edema: Denies  HISTORY:  Prior history reviewed and updated per electronic medical record.  Patient Active Problem List   Diagnosis Date Noted  . 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 Chronic left shoulder pain 03/24/2018  . Dyslipidemia 02/11/2018    Lab Results  Component Value Date   CHOL 213 (H) 02/06/2018   HDL 47.20 02/06/2018   LDLCALC 128 (H) 02/06/2018   TRIG 190.0 (H) 02/06/2018   CHOLHDL 5 02/06/2018   The 10-year ASCVD risk score Mikey Bussing DC Jr., et al., 2013) is: 8.7%   Values used to calculate the score:      Age: 43 years     Sex: Male     Is Non-Hispanic African American: Yes     Diabetic: No     Tobacco smoker: No     Systolic Blood Pressure: 412 mmHg     Is BP treated: Yes     HDL Cholesterol: 47.2 mg/dL     Total Cholesterol: 213 mg/dL    . Prediabetes 02/11/2018    Lab Results  Component Value Date   HGBA1C 6.1 02/06/2018   Lab Results  Component Value Date   MICROALBUR 0.51 10/10/2007   LDLCALC 128 (H) 02/06/2018   CREATININE 1.07 02/06/2018      . Essential hypertension 02/06/2018   Social History   Occupational History  . Not on file  Tobacco Use  . Smoking status: Never Smoker  . Smokeless tobacco: Never Used  Substance and Sexual Activity  . Alcohol use: Yes  . Drug use: Not on file  . Sexual activity: Yes   Social History   Social History Narrative  . Not on file   Past Medical History:  Diagnosis Date  . Hypertension    History reviewed. No pertinent surgical history. family history includes Heart disease in his mother. There is no history of Prostate cancer or Colon cancer.  OBJECTIVE:  VS:  HT:6' (182.9 cm)   WT:239 lb 12.8 oz (108.8 kg)  BMI:32.52    BP:(!) 148/88  HR:95bpm  TEMP: ( )  RESP:96 %  PHYSICAL EXAM: CONSTITUTIONAL: Well-developed, Well-nourished and In no acute distress EYES: Pupils are equal., EOM intact without nystagmus. and No scleral icterus. Psychiatric: Alert & appropriately interactive. and Not depressed or anxious appearing. EXTREMITY EXAM: Warm and well perfused  Left shoulder is overall well aligned without significant deformity.  He has full overhead range of motion.  Scapular dyskinesis is present.  He has good strength with internal and external rotation of the shoulder.  Empty can testing, speeds testing and O'Brien's testing is pain-free.  No significant pain or crepitation with axial load and circumduction.  Negative Spurling's compression test and Lhermitte's compression test we does have tightness with  brachial plexus squeeze that is nonpainful.  No pain with arm squeeze test.   ASSESSMENT:  1. Chronic left shoulder pain   2. Somatic dysfunction of cervical region   3. Somatic dysfunction of thoracic region   4. Somatic dysfunction of rib cage region   5. Herpes labialis     PROCEDURES:  PROCEDURE NOTE: OSTEOPATHIC MANIPULATION  The decision today to treat with Osteopathic Manipulative Therapy (OMT) was based on physical exam findings. Verbal consent was obtained following a discussion with the patient regarding the of risks, benefits and potential side effects, including an acute pain flare,post manipulation soreness and need for repeat treatments. Additionally, we specifically discussed the minimal risk of  injury to neurovascular structures associated with Cervical manipulation. 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 Patient was given medications, exercises, stretches and lifestyle modifications per AVS and verbally.  PROCEDURE NOTE: THERAPEUTIC EXERCISES [phone])  Discussed the foundation of treatment for this condition is physical therapy and/or daily (5-6 days/week) therapeutic exercises, focusing on core strengthening, coordination, neuromuscular control/reeducation. 15 minutes spent for Therapeutic exercises as below and as referenced in the AVS. This included exercises focusing on stretching, strengthening, with significant focus on eccentric aspects.  Proper technique shown and discussed handout in great detail with ATC. All questions were  discussed and answered.  Long term goals include an improvement in range of motion, strength, endurance as well as avoiding reinjury. Frequency of visits is one time as determined during today's office visit. Frequency of exercises to be performed is as per handout. EXERCISES REVIEWED: Cervical Towel Stretching Exercises Intrinsic Rotator Cuff Exercises Scapular Stabilization     PLAN:  Pertinent additional documentation may be included in corresponding procedure notes, imaging studies, problem based documentation and patient instructions.  Herpes labialis    Subjective Report:  Patient reports a small lesion on his left inferior lip.  This is new over the past 48 hours.  He has not had any known exposures.  Denies any fevers or chills.  Physical exam does not show a small approximately 0.5 cm in diameter shallow ulcer on the left inferior lip.  Consistent with herpes labialis.     Assessment & Plan & Follow up Issues:  Acute condition  -initial flare.  This is been present for 2 days and will benefit from Valtrex. 1. Rx for Valtrex discussed and recommend avoidance of contact       Functional shoulder pain is exacerbated with overhead motions.  He  has no significant mechanical symptoms but does have a tight anterior chain in both cervical spine and shoulders.  Home Therapeutic Exercises: New program prescribed today per procedure note.  Osteopathic manipulation was performed today based on physical exam findings.  Patient has responded well to osteopathic manipulation previously the prior manipulation did not provide permanent long lasting relief.  The patient does feel as though there was significant benefit to the prior manipulation and they wished for repeat manipulation today.  They understand that home therapeutic exercises are critical part of the healing/treatment process and will continue with self treatment between now and their next visit as outlined.  The patient understands  that the frequency of visits is meant to provide a stimulus to promote the body's own ability to heal and is not meant to be the sole means for improvement in their symptoms.  Activity modifications and the importance of avoiding exacerbating activities (limiting pain to no more than a 4 / 10 during or following activity) recommended and discussed.   Discussed red flag symptoms that warrant earlier emergent evaluation and patient voices understanding.    Meds ordered this encounter  Medications  . valACYclovir (VALTREX) 500 MG tablet    Sig: Take 2 tablets (1,000 mg total) by mouth 2 (two) times daily for 5 days.    Dispense:  20 tablet    Refill:  0   Lab Orders  No laboratory test(s) ordered today   Imaging Orders  No imaging studies ordered today   Referral Orders  No referral(s) requested today    At follow up will plan to consider: repeat osteopathic manipulation  Return in about 4 weeks (around 04/21/2018).          Gerda Diss, [place] [facility] Physician

## 2018-03-25 ENCOUNTER — Other Ambulatory Visit: Payer: BLUE CROSS/BLUE SHIELD | Admitting: Orthotics

## 2018-03-27 ENCOUNTER — Encounter: Payer: Self-pay | Admitting: Sports Medicine

## 2018-03-27 DIAGNOSIS — B001 Herpesviral vesicular dermatitis: Secondary | ICD-10-CM | POA: Insufficient documentation

## 2018-03-27 NOTE — Assessment & Plan Note (Signed)
Subjective Report:  Patient reports a small lesion on his left inferior lip.  This is new over the past 48 hours.  He has not had any known exposures.  Denies any fevers or chills.  Physical exam does not show a small approximately 0.5 cm in diameter shallow ulcer on the left inferior lip.  Consistent with herpes labialis.     Assessment & Plan & Follow up Issues:  Acute condition  -initial flare.  This is been present for 2 days and will benefit from Valtrex. 1. Rx for Valtrex discussed and recommend avoidance of contact

## 2018-04-12 ENCOUNTER — Other Ambulatory Visit: Payer: Self-pay | Admitting: Family Medicine

## 2018-04-23 ENCOUNTER — Ambulatory Visit: Payer: BLUE CROSS/BLUE SHIELD | Admitting: Sports Medicine

## 2018-04-24 ENCOUNTER — Ambulatory Visit: Payer: BLUE CROSS/BLUE SHIELD | Admitting: Orthotics

## 2018-04-24 ENCOUNTER — Other Ambulatory Visit: Payer: Self-pay

## 2018-04-24 DIAGNOSIS — M722 Plantar fascial fibromatosis: Secondary | ICD-10-CM

## 2018-04-24 NOTE — Progress Notes (Signed)
Making f/o 3/8" more narrow.

## 2018-04-27 ENCOUNTER — Other Ambulatory Visit: Payer: Self-pay | Admitting: Family Medicine

## 2018-04-30 ENCOUNTER — Other Ambulatory Visit: Payer: Self-pay

## 2018-04-30 ENCOUNTER — Ambulatory Visit: Payer: BLUE CROSS/BLUE SHIELD | Admitting: Podiatry

## 2018-04-30 VITALS — Temp 98.1°F

## 2018-04-30 DIAGNOSIS — M722 Plantar fascial fibromatosis: Secondary | ICD-10-CM

## 2018-04-30 MED ORDER — MELOXICAM 15 MG PO TABS: 15.0000 mg | ORAL_TABLET | Freq: Every day | ORAL | 1 refills | Status: DC

## 2018-04-30 NOTE — Progress Notes (Signed)
   Subjective: Patient presents for follow-up treatment regarding plantar fasciitis the right foot.  Patient was last seen on 02/05/2018.  Injection helped for approximately 1 month.  In the meantime he is received his custom molded orthotics, however they are too wide and they are currently being modified.  He is set to pick them up in approximately 2 weeks.  Past Medical History:  Diagnosis Date  . Hypertension      Objective: Physical Exam General: The patient is alert and oriented x3 in no acute distress.  Dermatology: Skin is warm, dry and supple bilateral lower extremities. Negative for open lesions or macerations bilateral.   Vascular: Dorsalis Pedis and Posterior Tibial pulses palpable bilateral.  Capillary fill time is immediate to all digits.  Neurological: Epicritic and protective threshold intact bilateral.   Musculoskeletal: Tenderness to palpation to the plantar aspect of the right heel along the plantar fascia. All other joints range of motion within normal limits bilateral. Strength 5/5 in all groups bilateral.   Radiographic exam: Normal osseous mineralization. Joint spaces preserved. No fracture/dislocation/boney destruction. No other soft tissue abnormalities or radiopaque foreign bodies.   Assessment: 1. Plantar fasciitis right  Plan of Care:  1. Patient evaluated. Xrays reviewed.   2. Injection of 0.5cc Celestone soluspan injected into the right plantar fascia  3.  Refill Rx for Meloxicam ordered for patient. 4.  Continue plantar fascial band 5.  Patient is set to receive his modified orthotics in approximately 2 weeks  6. Return to clinic as needed  Works at [employer]    Edrick Kins, DPM [facility]  Dr. Edrick Kins, DPM    [address], [place] [address]                Office [phone]  Fax [phone]

## 2018-05-01 ENCOUNTER — Other Ambulatory Visit: Payer: Self-pay

## 2018-05-01 ENCOUNTER — Encounter: Payer: Self-pay | Admitting: Family Medicine

## 2018-05-01 MED ORDER — LOSARTAN POTASSIUM 100 MG PO TABS: 100.0000 mg | ORAL_TABLET | Freq: Every day | ORAL | 3 refills | Status: DC

## 2018-05-02 ENCOUNTER — Encounter: Payer: Self-pay | Admitting: Family Medicine

## 2018-05-02 ENCOUNTER — Ambulatory Visit: Payer: Self-pay

## 2018-05-02 ENCOUNTER — Ambulatory Visit (INDEPENDENT_AMBULATORY_CARE_PROVIDER_SITE_OTHER): Payer: BLUE CROSS/BLUE SHIELD | Admitting: Family Medicine

## 2018-05-02 DIAGNOSIS — I10 Essential (primary) hypertension: Secondary | ICD-10-CM

## 2018-05-02 DIAGNOSIS — R0781 Pleurodynia: Secondary | ICD-10-CM

## 2018-05-02 MED ORDER — TRAMADOL HCL 50 MG PO TABS: 50.0000 mg | ORAL_TABLET | Freq: Three times a day (TID) | ORAL | 0 refills | Status: AC | PRN

## 2018-05-02 NOTE — Assessment & Plan Note (Addendum)
At goal.  Continue losartan 100 mg daily.  Advised patient that anti-inflammatories could potentially increase blood pressure.  Patient voiced understanding.  Continue home blood pressure monitoring with goal 140/90 or lower.

## 2018-05-02 NOTE — Telephone Encounter (Signed)
FYI

## 2018-05-02 NOTE — Progress Notes (Signed)
    Chief Complaint:  Henry Young is a 48 y.o. male who presents today for a virtual office visit with a chief complaint of Rib Pain.   Assessment/Plan:  Rib pain No red flags.  Likely muscular strain/spasm.  Encourage patient to start Mobic that was recently prescribed to him by his podiatrist.  Also gave short supply of tramadol to promote deep inspiration.  Discussed breathing exercises.  Discussed red flags and reasons to return to care/seek emergent care.  Follow-up as needed.  Essential hypertension At goal.  Continue losartan 100 mg daily.  Advised patient that anti-inflammatories could potentially increase blood pressure.  Patient voiced understanding.  Continue home blood pressure monitoring with goal 140/90 or lower.     Subjective:  HPI:  Rib Pain  Pain started this morning.  Stable at that time.  Patient was at work when he held in the sneeze and started feeling pain in his left lower rib wall.  Has not tried anything for it.  Occasionally worse with deep inspiration.  No other obvious precipitating events.  No other obvious alleviating or aggravating factors.  No shortness of breath.  No fevers or chills.  # Essential Hypertension - On losartan 100mg  daily and tolerating well - ROS: No chest pain or shortness of breath.   ROS: Per HPI  PMH: He reports that he has never smoked. He has never used smokeless tobacco. He reports current alcohol use. No history on file for drug.      Objective/Observations  Physical Exam: Gen: NAD, resting comfortably Pulm: Normal work of breathing Neuro: Grossly normal, moves all extremities Psych: Normal affect and thought content  No results found for this or any previous visit (from the past 24 hour(s)).   Virtual Visit via Video   I connected with Henry Young on 05/02/18 at 11:00 AM EDT by a video enabled telemedicine application and verified that I am speaking with the correct person using two identifiers. I discussed  the limitations of evaluation and management by telemedicine and the availability of in person appointments. The patient expressed understanding and agreed to proceed.   Patient location: Home Provider location: [place] participating in the virtual visit: Myself and Patient     Algis Greenhouse. [name], MD 05/02/2018 11:48 AM

## 2018-05-02 NOTE — Telephone Encounter (Signed)
See note, patient is scheduled for doxy appt at 11am.

## 2018-05-02 NOTE — Telephone Encounter (Signed)
Patient called and says he's at work and sneezed, but held it in to keep people from looking at him. He says he felt a pop on the left side where is ribs are. He says it's not painful, just sore when he moves a certain way. He denies SOB. He says the soreness is a 5 on the pain scale, but is only felt when he moves. He's asking for some advice from Dr. [name] as to what it could be and what he needs to do. I advised home care advice and that I will send over to the office for his recommendation, then someone will call back with that recommendation.  Reason for Disposition . [1] MILD pain (e.g., does not interfere with normal activities) AND [2] present < 7 days  Answer Assessment - Initial Assessment Questions 1. ONSET: "When did the muscle aches or body pains start?"      Today after sneezing, I held the sneeze in and felt a pop to the left side where my rib cage is 2. LOCATION: "What part of your body is hurting?" (e.g., entire body, arms, legs)      Left rib cage on the left side 3. SEVERITY: "How bad is the pain?" (Scale 1-10; or mild, moderate, severe)   - MILD (1-3): doesn't interfere with normal activities    - MODERATE (4-7): interferes with normal activities or awakens from sleep    - SEVERE (8-10):  excruciating pain, unable to do any normal activities      5 soreness felt when I move a certain way 4. CAUSE: "What do you think is causing the pains?"     From the sneeze 5. FEVER: "Have you been having fever?"     No 6. OTHER SYMPTOMS: "Do you have any other symptoms?" (e.g., chest pain, weakness, rash, cold or flu symptoms, weight loss)     No 7. PREGNANCY: "Is there any chance you are pregnant?" "When was your last menstrual period?"     N/A 8. TRAVEL: "Have you traveled out of the country in the last month?" (e.g., travel history, exposures)     No  Protocols used: MUSCLE ACHES AND BODY PAIN-A-AH

## 2018-05-16 ENCOUNTER — Encounter: Payer: Self-pay | Admitting: Physical Therapy

## 2018-05-26 ENCOUNTER — Encounter: Payer: Self-pay | Admitting: Family Medicine

## 2018-05-28 ENCOUNTER — Ambulatory Visit: Payer: BLUE CROSS/BLUE SHIELD | Admitting: Podiatry

## 2018-05-29 ENCOUNTER — Ambulatory Visit: Payer: BLUE CROSS/BLUE SHIELD | Admitting: Sports Medicine

## 2018-05-29 ENCOUNTER — Ambulatory Visit: Payer: BLUE CROSS/BLUE SHIELD | Admitting: Family Medicine

## 2018-05-29 NOTE — Progress Notes (Deleted)
[facility] [address], [place] [address] Phone: [phone] Subjective:   Henry Young, am serving as a scribe for Dr. Hulan Saas.  I'm seeing this patient by the request  of:    CC:   [id number]  Henry Young is a 48 y.o. male coming in with complaint of ***  Onset-  Location Duration-  Character- Aggravating factors- Reliving factors-  Therapies tried-  Severity-     Past Medical History:  Diagnosis Date  . Hypertension    Young past surgical history on file. Social History   Socioeconomic History  . Marital status: Single    Spouse name: Not on file  . Number of children: Not on file  . Years of education: Not on file  . Highest education level: Not on file  Occupational History  . Not on file  Social Needs  . Financial resource strain: Not on file  . Food insecurity:    Worry: Not on file    Inability: Not on file  . Transportation needs:    Medical: Not on file    Non-medical: Not on file  Tobacco Use  . Smoking status: Never Smoker  . Smokeless tobacco: Never Used  Substance and Sexual Activity  . Alcohol use: Yes  . Drug use: Not on file  . Sexual activity: Yes  Lifestyle  . Physical activity:    Days per week: Not on file    Minutes per session: Not on file  . Stress: Not on file  Relationships  . Social connections:    Talks on phone: Not on file    Gets together: Not on file    Attends religious service: Not on file    Active member of club or organization: Not on file    Attends meetings of clubs or organizations: Not on file    Relationship status: Not on file  Other Topics Concern  . Not on file  Social History Narrative  . Not on file   Young Known Allergies Family History  Problem Relation Age of Onset  . Heart disease Mother   . Prostate cancer Neg Hx   . Colon cancer Neg Hx      Current Outpatient Medications (Cardiovascular):  .  atorvastatin (LIPITOR) 40 MG tablet,  Take 1 tablet (40 mg total) by mouth daily. Marland Kitchen  losartan (COZAAR) 100 MG tablet, Take 1 tablet (100 mg total) by mouth daily.   Current Outpatient Medications (Analgesics):  .  ibuprofen (ADVIL,MOTRIN) 800 MG tablet, Take 1 tablet (800 mg total) by mouth every 8 (eight) hours as needed. .  meloxicam (MOBIC) 15 MG tablet, Take 1 tablet (15 mg total) by mouth daily.      Past medical history, social, surgical and family history all reviewed in electronic medical record.  Young pertanent information unless stated regarding to the chief complaint.   Review of Systems:  Young headache, visual changes, nausea, vomiting, diarrhea, constipation, dizziness, abdominal pain, skin rash, fevers, chills, night sweats, weight loss, swollen lymph nodes, body aches, joint swelling, muscle aches, chest pain, shortness of breath, mood changes.   Objective  There were Young vitals taken for this visit. Systems examined below as of    General: Young apparent distress alert and oriented x3 mood and affect normal, dressed appropriately.  HEENT: Pupils equal, extraocular movements intact  Respiratory: Patient's speak in full sentences and does not appear short of breath  Cardiovascular: Young lower extremity edema, non  tender, Young erythema  Skin: Warm dry intact with Young signs of infection or rash on extremities or on axial skeleton.  Abdomen: Soft nontender  Neuro: Cranial nerves II through XII are intact, neurovascularly intact in all extremities with 2+ DTRs and 2+ pulses.  Lymph: Young lymphadenopathy of posterior or anterior cervical chain or axillae bilaterally.  Gait normal with good balance and coordination.  MSK:  Non tender with full range of motion and good stability and symmetric strength and tone of shoulders, elbows, wrist, hip, knee and ankles bilaterally.     Impression and Recommendations:     This case required medical decision making of moderate complexity. The above documentation has been reviewed and is  accurate and complete Henry Young       Note: This dictation was prepared with Dragon dictation along with smaller phrase technology. Any transcriptional errors that result from this process are unintentional.

## 2018-06-04 ENCOUNTER — Other Ambulatory Visit: Payer: Self-pay

## 2018-06-04 ENCOUNTER — Encounter: Payer: Self-pay | Admitting: Podiatry

## 2018-06-04 ENCOUNTER — Ambulatory Visit: Payer: BLUE CROSS/BLUE SHIELD | Admitting: Podiatry

## 2018-06-04 VITALS — Temp 97.3°F

## 2018-06-04 DIAGNOSIS — M722 Plantar fascial fibromatosis: Secondary | ICD-10-CM | POA: Diagnosis not present

## 2018-06-04 MED ORDER — METHYLPREDNISOLONE 4 MG PO TBPK: ORAL_TABLET | ORAL | 0 refills | Status: DC

## 2018-06-04 MED ORDER — DICLOFENAC SODIUM 75 MG PO TBEC: 75.0000 mg | DELAYED_RELEASE_TABLET | Freq: Two times a day (BID) | ORAL | 1 refills | Status: DC

## 2018-06-04 MED ORDER — TRAMADOL HCL 50 MG PO TABS: 50.0000 mg | ORAL_TABLET | Freq: Four times a day (QID) | ORAL | 0 refills | Status: AC | PRN

## 2018-06-08 NOTE — Progress Notes (Signed)
   Subjective: 48 year old male presenting today for follow up evaluation of plantar fasciitis of the right foot. He states the pain is not improving. He believes his orthotics are too wide for him. He has been taking the Meloxicam and using the fascial brace with no significant relief. Patient is here for further evaluation and treatment.   Past Medical History:  Diagnosis Date  . Hypertension      Objective: Physical Exam General: The patient is alert and oriented x3 in no acute distress.  Dermatology: Skin is warm, dry and supple bilateral lower extremities. Negative for open lesions or macerations bilateral.   Vascular: Dorsalis Pedis and Posterior Tibial pulses palpable bilateral.  Capillary fill time is immediate to all digits.  Neurological: Epicritic and protective threshold intact bilateral.   Musculoskeletal: Tenderness to palpation to the plantar aspect of the right heel along the plantar fascia. All other joints range of motion within normal limits bilateral. Strength 5/5 in all groups bilateral.   Assessment: 1. Plantar fasciitis right  Plan of Care:  1. Patient evaluated.   2. Injection of 0.5cc Celestone soluspan injected into the right plantar fascia  3. Prescription for Medrol Dose Pak provided to patient. 4. Prescription for Diclofenac provided to patient. 5. Prescription for Tramadol provided to patient.  6. Custom orthotics are too wide in the heel. Left with me today for modification.  7. Return to clinic in 4 weeks.    Works at [employer] Going for a four day golf trip at [facility].    Edrick Kins, DPM [facility]  Dr. Edrick Kins, DPM    [address], [place] [address]                Office [phone]  Fax [phone]

## 2018-06-23 ENCOUNTER — Other Ambulatory Visit: Payer: Self-pay

## 2018-06-23 ENCOUNTER — Telehealth: Payer: Self-pay | Admitting: Podiatry

## 2018-06-23 ENCOUNTER — Ambulatory Visit: Payer: BC Managed Care – PPO | Admitting: Family Medicine

## 2018-06-23 NOTE — Progress Notes (Deleted)
[facility] [address], [place] [address] Phone: [phone] Subjective:     CC: rib and left shoulder pain  [id number]  Henry Young is a 48 y.o. male coming in with complaint of ***  Onset-  Location Duration-  Character- Aggravating factors- Reliving factors-  Therapies tried-  Severity-     Past Medical History:  Diagnosis Date  . Hypertension    No past surgical history on file. Social History   Socioeconomic History  . Marital status: Single    Spouse name: Not on file  . Number of children: Not on file  . Years of education: Not on file  . Highest education level: Not on file  Occupational History  . Not on file  Social Needs  . Financial resource strain: Not on file  . Food insecurity    Worry: Not on file    Inability: Not on file  . Transportation needs    Medical: Not on file    Non-medical: Not on file  Tobacco Use  . Smoking status: Never Smoker  . Smokeless tobacco: Never Used  Substance and Sexual Activity  . Alcohol use: Yes  . Drug use: Not on file  . Sexual activity: Yes  Lifestyle  . Physical activity    Days per week: Not on file    Minutes per session: Not on file  . Stress: Not on file  Relationships  . Social Herbalist on phone: Not on file    Gets together: Not on file    Attends religious service: Not on file    Active member of club or organization: Not on file    Attends meetings of clubs or organizations: Not on file    Relationship status: Not on file  Other Topics Concern  . Not on file  Social History Narrative  . Not on file   No Known Allergies Family History  Problem Relation Age of Onset  . Heart disease Mother   . Prostate cancer Neg Hx   . Colon cancer Neg Hx     Current Outpatient Medications (Endocrine & Metabolic):  .  methylPREDNISolone (MEDROL DOSEPAK) 4 MG TBPK tablet, 6 day dose pack - take as directed  Current Outpatient Medications  (Cardiovascular):  .  atorvastatin (LIPITOR) 40 MG tablet, Take 1 tablet (40 mg total) by mouth daily. Marland Kitchen  losartan (COZAAR) 100 MG tablet, Take 1 tablet (100 mg total) by mouth daily.   Current Outpatient Medications (Analgesics):  .  diclofenac (VOLTAREN) 75 MG EC tablet, Take 1 tablet (75 mg total) by mouth 2 (two) times daily. Marland Kitchen  ibuprofen (ADVIL,MOTRIN) 800 MG tablet, Take 1 tablet (800 mg total) by mouth every 8 (eight) hours as needed. .  meloxicam (MOBIC) 15 MG tablet, Take 1 tablet (15 mg total) by mouth daily.      Past medical history, social, surgical and family history all reviewed in electronic medical record.  No pertanent information unless stated regarding to the chief complaint.   Review of Systems:  No headache, visual changes, nausea, vomiting, diarrhea, constipation, dizziness, abdominal pain, skin rash, fevers, chills, night sweats, weight loss, swollen lymph nodes, body aches, joint swelling, muscle aches, chest pain, shortness of breath, mood changes.   Objective  There were no vitals taken for this visit. Systems examined below as of    General: No apparent distress alert and oriented x3 mood and affect normal, dressed appropriately.  HEENT: Pupils  equal, extraocular movements intact  Respiratory: Patient's speak in full sentences and does not appear short of breath  Cardiovascular: No lower extremity edema, non tender, no erythema  Skin: Warm dry intact with no signs of infection or rash on extremities or on axial skeleton.  Abdomen: Soft nontender  Neuro: Cranial nerves II through XII are intact, neurovascularly intact in all extremities with 2+ DTRs and 2+ pulses.  Lymph: No lymphadenopathy of posterior or anterior cervical chain or axillae bilaterally.  Gait normal with good balance and coordination.  MSK:  Non tender with full range of motion and good stability and symmetric strength and tone of shoulders, elbows, wrist, hip, knee and ankles bilaterally.    Neck: Inspection unremarkable. No palpable stepoffs. Negative Spurling's maneuver. Full neck range of motion Grip strength and sensation normal in bilateral hands Strength good C4 to T1 distribution No sensory change to C4 to T1 Negative Hoffman sign bilaterally Reflexes normal  Back Exam:  Inspection: Unremarkable  Motion: Flexion 45 deg, Extension 45 deg, Side Bending to 45 deg bilaterally,  Rotation to 45 deg bilaterally  SLR laying: Negative  XSLR laying: Negative  Palpable tenderness: None. FABER: negative. Sensory change: Gross sensation intact to all lumbar and sacral dermatomes.  Reflexes: 2+ at both patellar tendons, 2+ at achilles tendons, Babinski's downgoing.  Strength at foot  Plantar-flexion: 5/5 Dorsi-flexion: 5/5 Eversion: 5/5 Inversion: 5/5  Leg strength  Quad: 5/5 Hamstring: 5/5 Hip flexor: 5/5 Hip abductors: 5/5  Gait unremarkable.  Osteopathic findings C2 flexed rotated and side bent right C4 flexed rotated and side bent left C6 flexed rotated and side bent left T3 extended rotated and side bent right inhaled third rib T9 extended rotated and side bent left L2 flexed rotated and side bent right Sacrum right on right     Impression and Recommendations:     This case required medical decision making of moderate complexity. The above documentation has been reviewed and is accurate and complete Lyndal Pulley, DO       Note: This dictation was prepared with Dragon dictation along with smaller phrase technology. Any transcriptional errors that result from this process are unintentional.

## 2018-06-23 NOTE — Telephone Encounter (Signed)
Pt left message asking about picking up orthotics.   I returned call and told pt he could just come in to pick them up and sign the form,he did not need an appt. Pt asked what time you close and I told him 5. He said he would be here by 5 today to pick them  up

## 2018-06-27 DIAGNOSIS — M722 Plantar fascial fibromatosis: Secondary | ICD-10-CM | POA: Diagnosis not present

## 2018-06-27 DIAGNOSIS — M7731 Calcaneal spur, right foot: Secondary | ICD-10-CM | POA: Diagnosis not present

## 2018-06-27 DIAGNOSIS — M71571 Other bursitis, not elsewhere classified, right ankle and foot: Secondary | ICD-10-CM | POA: Diagnosis not present

## 2018-07-02 ENCOUNTER — Ambulatory Visit: Payer: BLUE CROSS/BLUE SHIELD | Admitting: Podiatry

## 2018-07-28 ENCOUNTER — Ambulatory Visit: Payer: Self-pay | Admitting: Podiatry

## 2018-08-14 ENCOUNTER — Ambulatory Visit: Payer: BC Managed Care – PPO | Admitting: Family Medicine

## 2018-08-14 ENCOUNTER — Other Ambulatory Visit: Payer: Self-pay

## 2018-08-14 ENCOUNTER — Other Ambulatory Visit: Payer: Self-pay | Admitting: Family Medicine

## 2018-08-14 ENCOUNTER — Encounter: Payer: Self-pay | Admitting: Family Medicine

## 2018-08-14 VITALS — BP 142/90 | HR 68 | Temp 98.6°F | Ht 72.0 in | Wt 242.2 lb

## 2018-08-14 DIAGNOSIS — R7303 Prediabetes: Secondary | ICD-10-CM | POA: Diagnosis not present

## 2018-08-14 DIAGNOSIS — I10 Essential (primary) hypertension: Secondary | ICD-10-CM

## 2018-08-14 DIAGNOSIS — E669 Obesity, unspecified: Secondary | ICD-10-CM

## 2018-08-14 DIAGNOSIS — Z6832 Body mass index (BMI) 32.0-32.9, adult: Secondary | ICD-10-CM

## 2018-08-14 DIAGNOSIS — R51 Headache: Secondary | ICD-10-CM

## 2018-08-14 DIAGNOSIS — E785 Hyperlipidemia, unspecified: Secondary | ICD-10-CM

## 2018-08-14 DIAGNOSIS — R519 Headache, unspecified: Secondary | ICD-10-CM

## 2018-08-14 MED ORDER — IBUPROFEN 800 MG PO TABS: 800.0000 mg | ORAL_TABLET | Freq: Three times a day (TID) | ORAL | 0 refills | Status: DC | PRN

## 2018-08-14 MED ORDER — ATORVASTATIN CALCIUM 40 MG PO TABS: 40.0000 mg | ORAL_TABLET | Freq: Every day | ORAL | 3 refills | Status: DC

## 2018-08-14 MED ORDER — LOSARTAN POTASSIUM-HCTZ 100-25 MG PO TABS: 1.0000 | ORAL_TABLET | Freq: Every day | ORAL | 3 refills | Status: DC

## 2018-08-14 NOTE — Assessment & Plan Note (Signed)
Above goal.  Home blood pressure readings in the 160s.  Will add on HCTZ to his regimen.  Will send in losartan-HCTZ 100-25 tablet once daily.  Check CBC and C met today.  Discussed home blood pressure monitoring with goal 140/90 or lower.

## 2018-08-14 NOTE — Assessment & Plan Note (Signed)
Discussed importance of lifestyle modifications.  Check A1c today.

## 2018-08-14 NOTE — Assessment & Plan Note (Signed)
Check lipid panel.  Continue atorvastatin 40 mg daily.

## 2018-08-14 NOTE — Progress Notes (Signed)
   Chief Complaint:  Henry Young is a 48 y.o. male who presents today with a chief complaint of HTN follow up.   Assessment/Plan:  Headache No red flags.  Will give 60 mg of Toradol today.  Encouraged good oral hydration.  Discussed reasons return to care.  Follow-up as needed.  Prediabetes Discussed importance of lifestyle modifications.  Check A1c today.  Dyslipidemia Check lipid panel.  Continue atorvastatin 40 mg daily.  Essential hypertension Above goal.  Home blood pressure readings in the 160s.  Will add on HCTZ to his regimen.  Will send in losartan-HCTZ 100-25 tablet once daily.  Check CBC and C met today.  Discussed home blood pressure monitoring with goal 140/90 or lower.    Subjective:  HPI:  Patient has had a "nagging" headache for the past few days.  It is currently mild in nature however has limited some of his work ability.  No obvious precipitating events.  He works outside and thinks he has been getting enough fluids however does note that his urine has been darker yellow in coloration.  No reported weakness or numbness.  Tried over-the-counter medications with no significant improvement.  His stable, chronic medical conditions are outlined below:  # Essential Hypertension - On losartan '100mg'$  daily and tolerating well - BPs usually in the 160s  # Dyslipidemia - On lipitor '40mg'$  daily and tolerating well  # Hyperglycemia - Working on improving diet  ROS: Per HPI  PMH: He reports that he has never smoked. He has never used smokeless tobacco. He reports current alcohol use. No history on file for drug.      Objective:  Physical Exam: BP (!) 142/90 (BP Location: Left Arm, Patient Position: Sitting, Cuff Size: Large)   Pulse 68   Temp 98.6 F (37 C) (Oral)   Ht 6' (1.829 m)   Wt 242 lb 3.2 oz (109.9 kg)   SpO2 98%   BMI 32.85 kg/m   Gen: NAD, resting comfortably CV: Regular rate and rhythm with no murmurs appreciated Pulm: Normal work of  breathing, clear to auscultation bilaterally with no crackles, wheezes, or rhonchi Neuro: Grossly normal, moves all extremities. CN2-12 intact.  Psych: Normal affect and thought content      M. [name], MD 08/14/2018 3:47 PM

## 2018-08-14 NOTE — Patient Instructions (Signed)
It was very nice to see you today!  Please start the new blood pressure medication.  Keep an eye on your blood pressure and let me know if persistently 140/90 or higher.  We will check blood work today.  Please make sure that you are staying well-hydrated.  We will give you a shot of a headache medication today called Toradol.  I will also refill your ibuprofen.  Please not take any until tomorrow.  Take care, Dr [name]  Please try these tips to maintain a healthy lifestyle:   Eat at least 3 REAL meals and 1-2 snacks per day.  Aim for no more than 5 hours between eating.  If you eat breakfast, please do so within one hour of getting up.    Obtain twice as many fruits/vegetables as protein or carbohydrate foods for both lunch and dinner. (Half of each meal should be fruits/vegetables, one quarter protein, and one quarter starchy carbs)   Cut down on sweet beverages. This includes juice, soda, and sweet tea.    Exercise at least 150 minutes every week.

## 2018-08-15 LAB — LIPID PANEL
Cholesterol: 128 mg/dL (ref 0–200)
HDL: 44.6 mg/dL (ref >39.00)
LDL Cholesterol: 53 mg/dL (ref 0–99)
NonHDL: 83.28
Total CHOL/HDL Ratio: 3
Triglycerides: 151 mg/dL — ABNORMAL HIGH (ref 0.0–149.0)
VLDL: 30.2 mg/dL (ref 0.0–40.0)

## 2018-08-15 LAB — COMPREHENSIVE METABOLIC PANEL
ALT: 33 U/L (ref 0–53)
AST: 29 U/L (ref 0–37)
Albumin: 4.1 g/dL (ref 3.5–5.2)
Alkaline Phosphatase: 69 U/L (ref 39–117)
BUN: 13 mg/dL (ref 6–23)
CO2: 27 mEq/L (ref 19–32)
Calcium: 9.1 mg/dL (ref 8.4–10.5)
Chloride: 106 mEq/L (ref 96–112)
Creatinine, Ser: 1.18 mg/dL (ref 0.40–1.50)
GFR: 79.52 mL/min (ref >60.00)
Glucose, Bld: 97 mg/dL (ref 70–99)
Potassium: 3.7 mEq/L (ref 3.5–5.1)
Sodium: 141 mEq/L (ref 135–145)
Total Bilirubin: 0.6 mg/dL (ref 0.2–1.2)
Total Protein: 6.7 g/dL (ref 6.0–8.3)

## 2018-08-15 LAB — HEMOGLOBIN A1C: Hgb A1c MFr Bld: 6.4 % (ref 4.6–6.5)

## 2018-08-15 LAB — CBC
HCT: 41.5 % (ref 39.0–52.0)
Hemoglobin: 13.4 g/dL (ref 13.0–17.0)
MCHC: 32.2 g/dL (ref 30.0–36.0)
MCV: 86.8 fl (ref 78.0–100.0)
Platelets: 232 10*3/uL (ref 150.0–400.0)
RBC: 4.78 Mil/uL (ref 4.22–5.81)
RDW: 14.7 % (ref 11.5–15.5)
WBC: 7 10*3/uL (ref 4.0–10.5)

## 2018-08-15 MED ORDER — KETOROLAC TROMETHAMINE 60 MG/2ML IM SOLN
60.0000 mg | Freq: Once | INTRAMUSCULAR | Status: AC
  Administered 2018-08-14: 60 mg via INTRAMUSCULAR

## 2018-08-15 NOTE — Progress Notes (Signed)
Please inform patient of the following:  Blood sugar has increased slightly since last check and is near the diabetic range. Cholesterol levels are much better.  Would like for him to really focus on avoidance of carbs and staying active and we can recheck blood sugar in 3-6 months.  All of his other labs are normal.  Henry [name], MD 08/15/2018 12:01 PM

## 2018-08-15 NOTE — Addendum Note (Signed)
Addended by: Loralyn Freshwater on: 08/15/2018 08:44 AM   Modules accepted: Orders

## 2018-08-19 ENCOUNTER — Telehealth: Payer: Self-pay | Admitting: Physical Therapy

## 2018-08-19 NOTE — Telephone Encounter (Signed)
Left voice message for patient to call clinic.  

## 2018-08-19 NOTE — Telephone Encounter (Signed)
Copied from [place] [id number]. Topic: Quick Communication - Lab Results (Clinic Use ONLY) >> Aug 18, 2018  5:00 PM Pauline Good wrote: Pt returning call lab results

## 2018-08-20 ENCOUNTER — Encounter

## 2018-08-20 ENCOUNTER — Ambulatory Visit (INDEPENDENT_AMBULATORY_CARE_PROVIDER_SITE_OTHER): Payer: BC Managed Care – PPO | Admitting: Podiatry

## 2018-08-20 ENCOUNTER — Encounter: Payer: Self-pay | Admitting: Podiatry

## 2018-08-20 ENCOUNTER — Other Ambulatory Visit: Payer: Self-pay

## 2018-08-20 VITALS — Temp 97.2°F

## 2018-08-20 DIAGNOSIS — M722 Plantar fascial fibromatosis: Secondary | ICD-10-CM | POA: Diagnosis not present

## 2018-08-22 ENCOUNTER — Other Ambulatory Visit: Payer: Self-pay

## 2018-08-22 MED ORDER — ATORVASTATIN CALCIUM 40 MG PO TABS: 40.0000 mg | ORAL_TABLET | Freq: Every day | ORAL | 3 refills | Status: DC

## 2018-08-25 NOTE — Progress Notes (Signed)
   Subjective: 48 year old male presenting today for follow up evaluation of plantar fasciitis of the right foot. He states he is improving. He states the pain is less frequent and less severe but has not resolved completley. He states the injections helped alleviate the symptoms. Being on the feet for long periods of time increases the pain. Patient is here for further evaluation and treatment.   Past Medical History:  Diagnosis Date  . Hypertension      Objective: Physical Exam General: The patient is alert and oriented x3 in no acute distress.  Dermatology: Skin is warm, dry and supple bilateral lower extremities. Negative for open lesions or macerations bilateral.   Vascular: Dorsalis Pedis and Posterior Tibial pulses palpable bilateral.  Capillary fill time is immediate to all digits.  Neurological: Epicritic and protective threshold intact bilateral.   Musculoskeletal: Tenderness to palpation to the plantar aspect of the right heel along the plantar fascia. All other joints range of motion within normal limits bilateral. Strength 5/5 in all groups bilateral.   Assessment: 1. Plantar fasciitis right  Plan of Care:  1. Patient evaluated.   2. Injection of 0.5cc Celestone soluspan injected into the right plantar fascia  3. Prescription for Medrol Dose Pak provided to patient. 4. Prescription for Diclofenac provided to patient. 5. Prescription for Tramadol provided to patient.  6. Custom orthotics are too wide in the heel. Left with me today for modification.  7. Return to clinic in 4 weeks.    Works at [employer]     Edrick Kins, DPM [facility]  Dr. Edrick Kins, DPM    [address], [place] [address]                Office [phone]  Fax [phone]

## 2018-10-13 ENCOUNTER — Other Ambulatory Visit (HOSPITAL_COMMUNITY)
Admission: RE | Admit: 2018-10-13 | Discharge: 2018-10-13 | Disposition: A | Discharge status: Home / Self Care | Payer: BC Managed Care – PPO | Source: Ambulatory Visit | Attending: Family Medicine | Admitting: Family Medicine

## 2018-10-13 ENCOUNTER — Ambulatory Visit (INDEPENDENT_AMBULATORY_CARE_PROVIDER_SITE_OTHER): Payer: BC Managed Care – PPO | Admitting: Family Medicine

## 2018-10-13 ENCOUNTER — Other Ambulatory Visit: Payer: Self-pay

## 2018-10-13 ENCOUNTER — Encounter: Payer: Self-pay | Admitting: Family Medicine

## 2018-10-13 VITALS — BP 128/86 | HR 85 | Temp 98.0°F | Ht 72.0 in | Wt 239.2 lb

## 2018-10-13 DIAGNOSIS — Z202 Contact with and (suspected) exposure to infections with a predominantly sexual mode of transmission: Secondary | ICD-10-CM

## 2018-10-13 DIAGNOSIS — R7303 Prediabetes: Secondary | ICD-10-CM | POA: Diagnosis not present

## 2018-10-13 DIAGNOSIS — E785 Hyperlipidemia, unspecified: Secondary | ICD-10-CM

## 2018-10-13 DIAGNOSIS — I10 Essential (primary) hypertension: Secondary | ICD-10-CM

## 2018-10-13 MED ORDER — METRONIDAZOLE 500 MG PO TABS: 500.0000 mg | ORAL_TABLET | Freq: Two times a day (BID) | ORAL | 0 refills | Status: AC

## 2018-10-13 NOTE — Progress Notes (Signed)
   Chief Complaint:  Corvon Kempker Delashmit is a 48 y.o. male who presents today with a chief complaint of STD exposure.   Assessment/Plan:  STD exposure Check urine gonorrhea/chlamydia/trichomonas.  Check HIV and RPR.  We will go ahead and start regular Flagyl given known exposure for trichomonas.  Prediabetes Last A1c 6.4.  Continue lifestyle modifications.  Does not want to start metformin.   Dyslipidemia Last LDL 83.  Continue atorvastatin 40 mg daily.  Essential hypertension At goal.  Continue losartan HCTZ 100-25 once daily.     Subjective:  HPI:  Patient presents today because he was exposed to trichomonas.  States that his longtime partner was diagnosed with trichomonas infection this morning.  He would like to be tested and treated today.  Does not have any current symptoms.  No dysuria.  No penile discharge.  No fevers or chills.  Has never had a sexually transmitted infection in the past.  His stable, chronic medical conditions are outlined below:  # Essential Hypertension - On losartan 100mg  daily and tolerating well - ROS: No chest pain or shortness of breath.   # Prediabetes - Diet controlled without medications  # Dyslipidemia - On atorvastatin 40mg  daily and tolerating well.  ROS: Per HPI  PMH: He reports that he has never smoked. He has never used smokeless tobacco. He reports current alcohol use. No history on file for drug.      Objective:  Physical Exam: BP 128/86   Pulse 85   Temp 98 F (36.7 C)   Ht 6' (1.829 m)   Wt 239 lb 3.2 oz (108.5 kg)   SpO2 98%   BMI 32.44 kg/m   Gen: NAD, resting comfortably MSK: No edema, cyanosis, or clubbing noted Skin: Warm, dry Neuro: Grossly normal, moves all extremities Psych: Normal affect and thought content      Caleb M. [name], MD 10/13/2018 4:00 PM

## 2018-10-13 NOTE — Assessment & Plan Note (Signed)
Last A1c 6.4.  Continue lifestyle modifications.  Does not want to start metformin.

## 2018-10-13 NOTE — Patient Instructions (Signed)
It was very nice to see you today!  We will check a urine sample and blood work today.  Please take the Flagyl.  Take care, Dr [name]  Please try these tips to maintain a healthy lifestyle:   Eat at least 3 REAL meals and 1-2 snacks per day.  Aim for no more than 5 hours between eating.  If you eat breakfast, please do so within one hour of getting up.    Obtain twice as many fruits/vegetables as protein or carbohydrate foods for both lunch and dinner. (Half of each meal should be fruits/vegetables, one quarter protein, and one quarter starchy carbs)   Cut down on sweet beverages. This includes juice, soda, and sweet tea.    Exercise at least 150 minutes every week.

## 2018-10-13 NOTE — Assessment & Plan Note (Signed)
Last LDL 83.  Continue atorvastatin 40 mg daily.

## 2018-10-13 NOTE — Assessment & Plan Note (Signed)
At goal.  Continue losartan-HCTZ 100-25 once daily.  

## 2018-10-14 LAB — HIV ANTIBODY (ROUTINE TESTING W REFLEX): HIV 1&2 Ab, 4th Generation: NONREACTIVE

## 2018-10-14 LAB — RPR: RPR Ser Ql: NONREACTIVE

## 2018-10-16 LAB — URINE CYTOLOGY ANCILLARY ONLY
Chlamydia: NEGATIVE
Comment: NEGATIVE
Comment: NEGATIVE
Comment: NORMAL
Neisseria Gonorrhea: NEGATIVE
Trichomonas: POSITIVE — AB

## 2018-10-16 NOTE — Progress Notes (Signed)
Please inform patient of the following:  Urine test is positive for trichomonas. This should be treated with the medication we sent in. All of his other tests were negative.  Algis Greenhouse. [name], MD 10/16/2018 3:58 PM

## 2018-11-04 ENCOUNTER — Encounter: Payer: Self-pay | Admitting: Family Medicine

## 2018-11-05 ENCOUNTER — Other Ambulatory Visit: Payer: Self-pay

## 2018-11-05 MED ORDER — METRONIDAZOLE 500 MG PO TABS: 500.0000 mg | ORAL_TABLET | Freq: Two times a day (BID) | ORAL | 0 refills | Status: AC

## 2018-12-17 ENCOUNTER — Encounter: Payer: Self-pay | Admitting: Family Medicine

## 2018-12-19 ENCOUNTER — Ambulatory Visit (INDEPENDENT_AMBULATORY_CARE_PROVIDER_SITE_OTHER): Payer: BC Managed Care – PPO | Admitting: Family Medicine

## 2018-12-19 ENCOUNTER — Encounter: Payer: Self-pay | Admitting: Family Medicine

## 2018-12-19 VITALS — BP 121/80

## 2018-12-19 DIAGNOSIS — R519 Headache, unspecified: Secondary | ICD-10-CM

## 2018-12-19 MED ORDER — PROCHLORPERAZINE MALEATE 10 MG PO TABS: ORAL_TABLET | ORAL | 0 refills | Status: DC

## 2018-12-19 MED ORDER — PREDNISONE 50 MG PO TABS: ORAL_TABLET | ORAL | 0 refills | Status: DC

## 2018-12-19 NOTE — Progress Notes (Signed)
   Chief Complaint:  Henry Young is a 48 y.o. male who presents today for a virtual office visit with a chief complaint of headache.   Assessment/Plan:  Headache No red flags.  Possibly migraines.  Will treat with migraine cocktail -prednisone, Compazine, and ibuprofen.  Discussed potential side effects of medications.  Discussed warning signs and reasons to return to care.  No follow-up if symptoms not improved with migraine cocktail.    Subjective:  HPI:  Headache Started 1 to 2 weeks ago.  Located behind his eyes.  Does not change significantly over the last couple of weeks.  Tried taking ibuprofen with modest improvement.  Symptoms come and go.  No weakness or numbness.  No nausea or vomiting.  No reported vision changes.  No photophobia or phonophobia.  Blood pressures been in the 120s over 80s.  No other obvious aggravating or alleviating factors.  Has had similar episodes in the past that have also persisted for weeks.  ROS: Per HPI  PMH: He reports that he has never smoked. He has never used smokeless tobacco. He reports current alcohol use. No history on file for drug.      Objective/Observations  Physical Exam: Gen: NAD, resting comfortably Pulm: Normal work of breathing Neuro: Grossly normal, moves all extremities Psych: Normal affect and thought content Neuro: CN 2-12 intact.  Moves all extremities spontaneously.  No results found for this or any previous visit (from the past 24 hour(s)).   Virtual Visit via Video   I connected with Henry Young on 12/19/18 at 11:00 AM EST by a video enabled telemedicine application and verified that I am speaking with the correct person using two identifiers. The limitations of evaluation and management by telemedicine and the availability of in person appointments were discussed. The patient expressed understanding and agreed to proceed.   Patient location: Home Provider location: [place]  participating in the virtual visit: Myself and Patient     Algis Greenhouse. [name], MD 12/19/2018 10:17 AM

## 2018-12-29 ENCOUNTER — Ambulatory Visit: Payer: BC Managed Care – PPO | Admitting: Family Medicine

## 2018-12-30 ENCOUNTER — Telehealth: Payer: Self-pay | Admitting: Family Medicine

## 2018-12-30 NOTE — Telephone Encounter (Signed)
Copied from [place] [id number]. Topic: General - Other >> Dec 30, 2018  4:27 PM Keene Breath wrote: Reason for CRM: Patient called to schedule a virtual appt. A soon as possible.  He is still having headaches.  CB# [phone]

## 2019-01-05 ENCOUNTER — Ambulatory Visit (INDEPENDENT_AMBULATORY_CARE_PROVIDER_SITE_OTHER): Payer: BC Managed Care – PPO | Admitting: Family Medicine

## 2019-01-05 DIAGNOSIS — R519 Headache, unspecified: Secondary | ICD-10-CM

## 2019-01-05 MED ORDER — DICLOFENAC SODIUM 75 MG PO TBEC: 75.0000 mg | DELAYED_RELEASE_TABLET | Freq: Two times a day (BID) | ORAL | 0 refills | Status: DC

## 2019-01-05 NOTE — Progress Notes (Signed)
    Chief Complaint:  Henry Young is a 48 y.o. male who presents today for a virtual office visit with a chief complaint of headache.   Assessment/Plan:  Headache Given onset of new symptoms will place referral to neurology.  Start diclofenac as needed.  Discussed reasons to return to care or seek emergent care.  Follow-up as needed.    Subjective:  HPI:  Patient seen about 3 weeks ago for headache. Was given migraine cocktail of prednisone, compazine, and ibuprofen which helped. Think that the prednisone helped more than anything else. Since our last visit he is still having headaches.  He has also developed some numbness in his left arm and shoulder.  It has been persistent.  No neck pain.      ROS: Per HPI  PMH: He reports that he has never smoked. He has never used smokeless tobacco. He reports current alcohol use. No history on file for drug.      Objective/Observations  Physical Exam: Gen: NAD, resting comfortably Pulm: Normal work of breathing Neuro: Grossly normal, moves all extremities Psych: Normal affect and thought content  No results found for this or any previous visit (from the past 24 hour(s)).   Virtual Visit via Video   I connected with Henry Young on 01/05/19 at 11:00 AM EST by a video enabled telemedicine application and verified that I am speaking with the correct person using two identifiers. The limitations of evaluation and management by telemedicine and the availability of in person appointments were discussed. The patient expressed understanding and agreed to proceed.   Patient location: Work Provider location: [place] participating in the virtual visit: Myself and Patient     Algis Greenhouse. [name], MD 01/05/2019 11:14 AM

## 2019-01-16 ENCOUNTER — Other Ambulatory Visit: Payer: Self-pay | Admitting: Family Medicine

## 2019-01-16 ENCOUNTER — Encounter: Payer: Self-pay | Admitting: Neurology

## 2019-01-16 NOTE — Telephone Encounter (Signed)
Last OV 01/05/19 Last refill - not on current med list Next OV 02/09/19

## 2019-01-19 NOTE — Progress Notes (Signed)
Virtual Visit via Video Note The purpose of this virtual visit is to provide medical care while limiting exposure to the novel coronavirus.    Consent was obtained for video visit:  Yes.   Answered questions that patient had about telehealth interaction:  Yes.   I discussed the limitations, risks, security and privacy concerns of performing an evaluation and management service by telemedicine. I also discussed with the patient that there may be a patient responsible charge related to this service. The patient expressed understanding and agreed to proceed.  Pt location: Home Physician Location: office Name of referring provider:  Vivi Barrack, MD I connected with Dalene Carrow at patients initiation/request on 01/20/2019 at 11:10 AM EST by video enabled telemedicine application and verified that I am speaking with the correct person using two identifiers. Pt MRN:  [id number] Pt DOB:  [birth year] Video Participants:  Traven D Ballo   History of Present Illness:  Henry Young is a 49 year old male who presents for headaches.  History supplemented by referring provider notes.  He has history of headaches on and off in the past..  He started having  headaches again in November.  No preceding trauma, new medication, or illness.  Blood pressure had been normal.  He received a headache cocktail by his PCP in early December, which was ineffective.  Then about a month ago, he started experiencing numbness down the left shoulder and arm which would make him up at night.  Headaches are moderate non-throbbing bi-temporal and retro-orbital pain.  No aggravating or relieving factors.  They initially were persistent but over the past 2 weeks occur briefly once or maybe twice a day, lasting about 30 minutes.  No associated nausea, vomiting, photophobia, phonophobia, osmophobia, autonomic symptoms or visual disturbance.  He points to increased emotional stress as a possible trigger.  He also had  been sleeping on the couch and noted a crick in his neck.  Current NSAIDS:  Diclofenac EC 75mg ; ibuprofen 800mg  Current analgesics:  none Current triptans:  none Current ergotamine:  none Current anti-emetic:  Compazine 10mg  Current muscle relaxants:  none Current anti-anxiolytic:  none Current sleep aide:  none Current Antihypertensive medications:  Losartan-HCTZ Current Antidepressant medications:  none Current Anticonvulsant medications:  none Current anti-CGRP:  none Current Vitamins/Herbal/Supplements:  none Current Antihistamines/Decongestants:  none Other therapy:  none  Past NSAIDS:  none Past analgesics:  tramadol Past abortive triptans:  none Past abortive ergotamine:  none Past muscle relaxants:  none Past anti-emetic:  none Past antihypertensive medications:  Propranolol ER 80mg ; Toprol XL Past antidepressant medications:  none Past anticonvulsant medications:  none Past anti-CGRP:  none Past vitamins/Herbal/Supplements:  none Past antihistamines/decongestants:  none Other past therapies:  none  CBC and CMP from 08/14/2018 were normal.  HIV and RPR from 10/13/2018 were non-reactive.   Past Medical History: Past Medical History:  Diagnosis Date  . Hypertension     Medications: Outpatient Encounter Medications as of 01/20/2019  Medication Sig  . atorvastatin (LIPITOR) 40 MG tablet Take 1 tablet (40 mg total) by mouth daily.  . diclofenac (VOLTAREN) 75 MG EC tablet Take 1 tablet (75 mg total) by mouth 2 (two) times daily.  Marland Kitchen ibuprofen (ADVIL) 800 MG tablet Take 1 tablet (800 mg total) by mouth every 8 (eight) hours as needed.  Marland Kitchen losartan-hydrochlorothiazide (HYZAAR) 100-25 MG tablet Take 1 tablet by mouth daily.  . prochlorperazine (COMPAZINE) 10 MG tablet TAKE 1 TABLET WITH IBUPROFEN AND PREDNISONE AS NEEDED  FOR MIGRAINES.   No facility-administered encounter medications on file as of 01/20/2019.    Allergies: No Known Allergies  Family History: Family  History  Problem Relation Age of Onset  . Heart disease Mother   . Prostate cancer Neg Hx   . Colon cancer Neg Hx     Social History: Social History   Socioeconomic History  . Marital status: Single    Spouse name: Not on file  . Number of children: 8  . Years of education: 72  . Highest education level: Not on file  Occupational History  . Occupation: [employer]   Tobacco Use  . Smoking status: Never Smoker  . Smokeless tobacco: Never Used  Substance and Sexual Activity  . Alcohol use: Yes  . Drug use: Not on file  . Sexual activity: Yes  Other Topics Concern  . Not on file  Social History Narrative   Right handed   One story home   Social Determinants of Health   Financial Resource Strain:   . Difficulty of Paying Living Expenses: Not on file  Food Insecurity:   . Worried About Charity fundraiser in the Last Year: Not on file  . Ran Out of Food in the Last Year: Not on file  Transportation Needs:   . Lack of Transportation (Medical): Not on file  . Lack of Transportation (Non-Medical): Not on file  Physical Activity:   . Days of Exercise per Week: Not on file  . Minutes of Exercise per Session: Not on file  Stress:   . Feeling of Stress : Not on file  Social Connections:   . Frequency of Communication with Friends and Family: Not on file  . Frequency of Social Gatherings with Friends and Family: Not on file  . Attends Religious Services: Not on file  . Active Member of Clubs or Organizations: Not on file  . Attends Archivist Meetings: Not on file  . Marital Status: Not on file  Intimate Partner Violence:   . Fear of Current or Ex-Partner: Not on file  . Emotionally Abused: Not on file  . Physically Abused: Not on file  . Sexually Abused: Not on file    Observations/Objective:   Height 6' (1.829 m), weight 230 lb (104.3 kg). No acute distress.  Alert and oriented.  Speech fluent and not dysarthric.  Language intact.  Eyes orthophoric on  primary gaze.  Face symmetric.  Assessment and Plan:   1.  Suspect tension-type headache, now not intractable 2.  Suspect left cervical radiculopathy  1.  Refer for physical therapy for neck pain and left sided cervical radiculopathy. 2.  Follow up in 3 to 4 months.  Follow Up Instructions:    -I discussed the assessment and treatment plan with the patient. The patient was provided an opportunity to ask questions and all were answered. The patient agreed with the plan and demonstrated an understanding of the instructions.   The patient was advised to call back or seek an in-person evaluation if the symptoms worsen or if the condition fails to improve.   Dudley Major, DO

## 2019-01-20 ENCOUNTER — Other Ambulatory Visit: Payer: Self-pay

## 2019-01-20 ENCOUNTER — Telehealth (INDEPENDENT_AMBULATORY_CARE_PROVIDER_SITE_OTHER): Payer: BC Managed Care – PPO | Admitting: Neurology

## 2019-01-20 ENCOUNTER — Encounter: Payer: Self-pay | Admitting: Neurology

## 2019-01-20 VITALS — Ht 72.0 in | Wt 230.0 lb

## 2019-01-20 DIAGNOSIS — R200 Anesthesia of skin: Secondary | ICD-10-CM | POA: Diagnosis not present

## 2019-01-20 DIAGNOSIS — G44209 Tension-type headache, unspecified, not intractable: Secondary | ICD-10-CM | POA: Diagnosis not present

## 2019-01-20 DIAGNOSIS — M542 Cervicalgia: Secondary | ICD-10-CM | POA: Diagnosis not present

## 2019-02-02 ENCOUNTER — Ambulatory Visit (INDEPENDENT_AMBULATORY_CARE_PROVIDER_SITE_OTHER): Payer: BC Managed Care – PPO | Admitting: Family Medicine

## 2019-02-02 ENCOUNTER — Encounter: Payer: Self-pay | Admitting: Family Medicine

## 2019-02-02 DIAGNOSIS — R519 Headache, unspecified: Secondary | ICD-10-CM | POA: Diagnosis not present

## 2019-02-02 NOTE — Progress Notes (Signed)
   Henry Young is a 49 y.o. male who presents today for a virtual office visit.  Assessment/Plan:  Chronic Problems Addressed Today: Nonintractable headache No red flags.  Symptoms still persist despite resolution of cervical radiculopathy.  Advised him to follow-up with neurology soon.  He voiced understanding.  He will continue taking Voltaren as needed.     Subjective:  HPI:  Patient seen about a month ago for headaches.  Was seen neurology since her last visit.  Still has persistent headaches.  Refer to physical therapy has not yet followed up with them.  He was having some left arm tingling as well which is improved.       Objective/Observations  Physical Exam: Gen: NAD, resting comfortably Pulm: Normal work of breathing Neuro: Grossly normal, moves all extremities Psych: Normal affect and thought content  Virtual Visit via Video   I connected with [place] D Fill on 02/02/19 at 11:20 AM EST by a video enabled telemedicine application and verified that I am speaking with the correct person using two identifiers. The limitations of evaluation and management by telemedicine and the availability of in person appointments were discussed. The patient expressed understanding and agreed to proceed.   Patient location: Home Provider location: [place] participating in the virtual visit: Myself and Patient     Algis Greenhouse. [name], MD 02/02/2019 1:01 PM

## 2019-02-02 NOTE — Assessment & Plan Note (Signed)
No red flags.  Symptoms still persist despite resolution of cervical radiculopathy.  Advised him to follow-up with neurology soon.  He voiced understanding.  He will continue taking Voltaren as needed.

## 2019-02-09 ENCOUNTER — Ambulatory Visit (INDEPENDENT_AMBULATORY_CARE_PROVIDER_SITE_OTHER): Payer: BC Managed Care – PPO | Admitting: Family Medicine

## 2019-02-09 ENCOUNTER — Encounter: Payer: Self-pay | Admitting: Family Medicine

## 2019-02-09 ENCOUNTER — Other Ambulatory Visit: Payer: Self-pay

## 2019-02-09 VITALS — BP 128/86 | HR 88 | Temp 97.2°F | Ht 72.0 in | Wt 245.0 lb

## 2019-02-09 DIAGNOSIS — R7303 Prediabetes: Secondary | ICD-10-CM

## 2019-02-09 DIAGNOSIS — R519 Headache, unspecified: Secondary | ICD-10-CM

## 2019-02-09 DIAGNOSIS — Z0001 Encounter for general adult medical examination with abnormal findings: Secondary | ICD-10-CM | POA: Diagnosis not present

## 2019-02-09 DIAGNOSIS — Z1211 Encounter for screening for malignant neoplasm of colon: Secondary | ICD-10-CM

## 2019-02-09 DIAGNOSIS — E785 Hyperlipidemia, unspecified: Secondary | ICD-10-CM

## 2019-02-09 DIAGNOSIS — I10 Essential (primary) hypertension: Secondary | ICD-10-CM | POA: Diagnosis not present

## 2019-02-09 DIAGNOSIS — Z125 Encounter for screening for malignant neoplasm of prostate: Secondary | ICD-10-CM | POA: Diagnosis not present

## 2019-02-09 MED ORDER — AZELASTINE HCL 0.1 % NA SOLN: 2.0000 | Freq: Two times a day (BID) | NASAL | 12 refills | Status: DC

## 2019-02-09 NOTE — Assessment & Plan Note (Signed)
Continue Lipitor 40 mg daily.  Check lipid panel.

## 2019-02-09 NOTE — Assessment & Plan Note (Signed)
Check A1c. 

## 2019-02-09 NOTE — Assessment & Plan Note (Signed)
Start Astelin nasal spray.  Has seen neurologist in the past.  If no improvement with Astelin will consider brain imaging to further evaluate and possibly look at sinuses as well.

## 2019-02-09 NOTE — Assessment & Plan Note (Signed)
At goal.  Continue losartan-HCTZ 100-25 once daily.  Check CBC, CMP, TSH.

## 2019-02-09 NOTE — Patient Instructions (Signed)
It was very nice to see you today!  We will check blood work.  Try the nasal spray and let me know if it helps with your headaches.   Take care, Dr [name]  Please try these tips to maintain a healthy lifestyle:   Eat at least 3 REAL meals and 1-2 snacks per day.  Aim for no more than 5 hours between eating.  If you eat breakfast, please do so within one hour of getting up.    Each meal should contain half fruits/vegetables, one quarter protein, and one quarter carbs (no bigger than a computer mouse)   Cut down on sweet beverages. This includes juice, soda, and sweet tea.     Drink at least 1 glass of water with each meal and aim for at least 8 glasses per day   Exercise at least 150 minutes every week.    Preventive Care 41-78 Years Old, Male Preventive care refers to lifestyle choices and visits with your health care provider that can promote health and wellness. This includes:  A yearly physical exam. This is also called an annual well check.  Regular dental and eye exams.  Immunizations.  Screening for certain conditions.  Healthy lifestyle choices, such as eating a healthy diet, getting regular exercise, not using drugs or products that contain nicotine and tobacco, and limiting alcohol use. What can I expect for my preventive care visit? Physical exam Your health care provider will check:  Height and weight. These may be used to calculate body mass index (BMI), which is a measurement that tells if you are at a healthy weight.  Heart rate and blood pressure.  Your skin for abnormal spots. Counseling Your health care provider may ask you questions about:  Alcohol, tobacco, and drug use.  Emotional well-being.  Home and relationship well-being.  Sexual activity.  Eating habits.  Work and work Statistician. What immunizations do I need?  Influenza (flu) vaccine  This is recommended every year. Tetanus, diphtheria, and pertussis (Tdap) vaccine  You  may need a Td booster every 10 years. Varicella (chickenpox) vaccine  You may need this vaccine if you have not already been vaccinated. Zoster (shingles) vaccine  You may need this after age 50. Measles, mumps, and rubella (MMR) vaccine  You may need at least one dose of MMR if you were born in 1957 or later. You may also need a second dose. Pneumococcal conjugate (PCV13) vaccine  You may need this if you have certain conditions and were not previously vaccinated. Pneumococcal polysaccharide (PPSV23) vaccine  You may need one or two doses if you smoke cigarettes or if you have certain conditions. Meningococcal conjugate (MenACWY) vaccine  You may need this if you have certain conditions. Hepatitis A vaccine  You may need this if you have certain conditions or if you travel or work in places where you may be exposed to hepatitis A. Hepatitis B vaccine  You may need this if you have certain conditions or if you travel or work in places where you may be exposed to hepatitis B. Haemophilus influenzae type b (Hib) vaccine  You may need this if you have certain risk factors. Human papillomavirus (HPV) vaccine  If recommended by your health care provider, you may need three doses over 6 months. You may receive vaccines as individual doses or as more than one vaccine together in one shot (combination vaccines). Talk with your health care provider about the risks and benefits of combination vaccines. What tests do  I need? Blood tests  Lipid and cholesterol levels. These may be checked every 5 years, or more frequently if you are over 12 years old.  Hepatitis C test.  Hepatitis B test. Screening  Lung cancer screening. You may have this screening every year starting at age 36 if you have a 30-pack-year history of smoking and currently smoke or have quit within the past 15 years.  Prostate cancer screening. Recommendations will vary depending on your family history and other risks.   Colorectal cancer screening. All adults should have this screening starting at age 70 and continuing until age 47. Your health care provider may recommend screening at age 41 if you are at increased risk. You will have tests every 1-10 years, depending on your results and the type of screening test.  Diabetes screening. This is done by checking your blood sugar (glucose) after you have not eaten for a while (fasting). You may have this done every 1-3 years.  Sexually transmitted disease (STD) testing. Follow these instructions at home: Eating and drinking  Eat a diet that includes fresh fruits and vegetables, whole grains, lean protein, and low-fat dairy products.  Take vitamin and mineral supplements as recommended by your health care provider.  Do not drink alcohol if your health care provider tells you not to drink.  If you drink alcohol: ? Limit how much you have to 0-2 drinks a day. ? Be aware of how much alcohol is in your drink. In the [place] one drink equals one 12 oz bottle of beer (355 mL), one 5 oz glass of wine (148 mL), or one 1 oz glass of hard liquor (44 mL). Lifestyle  Take daily care of your teeth and gums.  Stay active. Exercise for at least 30 minutes on 5 or more days each week.  Do not use any products that contain nicotine or tobacco, such as cigarettes, e-cigarettes, and chewing tobacco. If you need help quitting, ask your health care provider.  If you are sexually active, practice safe sex. Use a condom or other form of protection to prevent STIs (sexually transmitted infections).  Talk with your health care provider about taking a low-dose aspirin every day starting at age 35. What's next?  Go to your health care provider once a year for a well check visit.  Ask your health care provider how often you should have your eyes and teeth checked.  Stay up to date on all vaccines. This information is not intended to replace advice given to you by your health  care provider. Make sure you discuss any questions you have with your health care provider. Document Revised: 12/19/2017 Document Reviewed: 12/19/2017 Elsevier Patient Education  2020 [employer].

## 2019-02-09 NOTE — Progress Notes (Signed)
Chief Complaint:  Henry Young is a 49 y.o. male who presents today for his annual comprehensive physical exam.    Assessment/Plan:  Chronic Problems Addressed Today: Nonintractable headache Start Astelin nasal spray.  Has seen neurologist in the past.  If no improvement with Astelin will consider brain imaging to further evaluate and possibly look at sinuses as well.  Prediabetes Check A1c.  Dyslipidemia Continue Lipitor 40 mg daily.  Check lipid panel.  Essential hypertension At goal.  Continue losartan-HCTZ 100-25 once daily.  Check CBC, CMP, TSH.  Preventative Healthcare: Check CBC, CMET, TSH, lipid panel. Check PSa and cologuard.   Patient Counseling(The following topics were reviewed and/or handout was given):  -Nutrition: Stressed importance of moderation in sodium/caffeine intake, saturated fat and cholesterol, caloric balance, sufficient intake of fresh fruits, vegetables, and fiber.  -Stressed the importance of regular exercise.   -Substance Abuse: Discussed cessation/primary prevention of tobacco, alcohol, or other drug use; driving or other dangerous activities under the influence; availability of treatment for abuse.   -Injury prevention: Discussed safety belts, safety helmets, smoke detector, smoking near bedding or upholstery.   -Sexuality: Discussed sexually transmitted diseases, partner selection, use of condoms, avoidance of unintended pregnancy and contraceptive alternatives.   -Dental health: Discussed importance of regular tooth brushing, flossing, and dental visits.  -Health maintenance and immunizations reviewed. Please refer to Health maintenance section.  Return to care in 1 year for next preventative visit.     Subjective:  HPI:  He has no acute complaints today.   Still has ongoing headaches. Worried about possible sinus congestion. No change since our last visit. He has not yet followed up with neurology.   Lifestyle Diet: None specific.   Exercise: Busy with work. Limited cardio. Trying to get back into bike riding.   Depression screen PHQ 2/9 02/09/2019  Decreased Interest 0  Down, Depressed, Hopeless 0  PHQ - 2 Score 0   There are no preventive care reminders to display for this patient.   ROS: Per HPI, otherwise a complete review of systems was negative.   PMH:  The following were reviewed and entered/updated in epic: Past Medical History:  Diagnosis Date  . Hypertension    Patient Active Problem List   Diagnosis Date Noted  . Nonintractable headache 02/02/2019  . 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 Chronic left shoulder pain 03/24/2018  . Dyslipidemia 02/11/2018  . Prediabetes 02/11/2018  . Essential hypertension 02/06/2018   History reviewed. No pertinent surgical history.  Family History  Problem Relation Age of Onset  . Heart disease Mother   . Prostate cancer Neg Hx   . Colon cancer Neg Hx     Medications- reviewed and updated Current Outpatient Medications  Medication Sig Dispense Refill  . atorvastatin (LIPITOR) 40 MG tablet Take 1 tablet (40 mg total) by mouth daily. 90 tablet 3  . diclofenac (VOLTAREN) 75 MG EC tablet Take 1 tablet (75 mg total) by mouth 2 (two) times daily. 30 tablet 0  . ibuprofen (ADVIL) 800 MG tablet Take 1 tablet (800 mg total) by mouth every 8 (eight) hours as needed. 90 tablet 0  . losartan-hydrochlorothiazide (HYZAAR) 100-25 MG tablet Take 1 tablet by mouth daily. 90 tablet 3  . prochlorperazine (COMPAZINE) 10 MG tablet TAKE 1 TABLET WITH IBUPROFEN AND PREDNISONE AS NEEDED FOR MIGRAINES. 30 tablet 0  . azelastine (ASTELIN) 0.1 % nasal spray Place 2 sprays into both nostrils 2 (two) times daily. 30 mL 12   No current facility-administered  medications for this visit.    Allergies-reviewed and updated No Known Allergies  Social History   Socioeconomic History  . Marital status: Single    Spouse name: Not on file  . Number of children: 8  . Years of education: 52  .  Highest education level: Not on file  Occupational History  . Occupation: [employer]   Tobacco Use  . Smoking status: Never Smoker  . Smokeless tobacco: Never Used  Substance and Sexual Activity  . Alcohol use: Yes  . Drug use: Not on file  . Sexual activity: Yes  Other Topics Concern  . Not on file  Social History Narrative   Right handed   One story home   Social Determinants of Health   Financial Resource Strain:   . Difficulty of Paying Living Expenses: Not on file  Food Insecurity:   . Worried About Charity fundraiser in the Last Year: Not on file  . Ran Out of Food in the Last Year: Not on file  Transportation Needs:   . Lack of Transportation (Medical): Not on file  . Lack of Transportation (Non-Medical): Not on file  Physical Activity:   . Days of Exercise per Week: Not on file  . Minutes of Exercise per Session: Not on file  Stress:   . Feeling of Stress : Not on file  Social Connections:   . Frequency of Communication with Friends and Family: Not on file  . Frequency of Social Gatherings with Friends and Family: Not on file  . Attends Religious Services: Not on file  . Active Member of Clubs or Organizations: Not on file  . Attends Archivist Meetings: Not on file  . Marital Status: Not on file        Objective:  Physical Exam: BP 128/86   Pulse 88   Temp (!) 97.2 F (36.2 C)   Ht 6' (1.829 m)   Wt 245 lb (111.1 kg)   SpO2 99%   BMI 33.23 kg/m   Body mass index is 33.23 kg/m. Wt Readings from Last 3 Encounters:  02/09/19 245 lb (111.1 kg)  01/16/19 230 lb (104.3 kg)  10/13/18 239 lb 3.2 oz (108.5 kg)   Gen: NAD, resting comfortably HEENT: TMs normal bilaterally. OP clear. No thyromegaly noted.  CV: RRR with no murmurs appreciated Pulm: NWOB, CTAB with no crackles, wheezes, or rhonchi GI: Normal bowel sounds present. Soft, Nontender, Nondistended. MSK: no edema, cyanosis, or clubbing noted Skin: warm, dry Neuro: CN2-12 grossly  intact. Strength 5/5 in upper and lower extremities. Reflexes symmetric and intact bilaterally.  Psych: Normal affect and thought content     Shina Wass M. [name], MD 02/09/2019 3:36 PM

## 2019-02-10 ENCOUNTER — Other Ambulatory Visit: Payer: Self-pay

## 2019-02-10 LAB — PSA: PSA: 1.57 ng/mL (ref 0.10–4.00)

## 2019-02-10 LAB — LDL CHOLESTEROL, DIRECT: Direct LDL: 44 mg/dL

## 2019-02-10 LAB — COMPREHENSIVE METABOLIC PANEL
ALT: 40 U/L (ref 0–53)
AST: 28 U/L (ref 0–37)
Albumin: 4.2 g/dL (ref 3.5–5.2)
Alkaline Phosphatase: 73 U/L (ref 39–117)
BUN: 16 mg/dL (ref 6–23)
CO2: 31 mEq/L (ref 19–32)
Calcium: 9.6 mg/dL (ref 8.4–10.5)
Chloride: 100 mEq/L (ref 96–112)
Creatinine, Ser: 1.29 mg/dL (ref 0.40–1.50)
GFR: 71.61 mL/min (ref >60.00)
Glucose, Bld: 107 mg/dL — ABNORMAL HIGH (ref 70–99)
Potassium: 3.7 mEq/L (ref 3.5–5.1)
Sodium: 140 mEq/L (ref 135–145)
Total Bilirubin: 0.7 mg/dL (ref 0.2–1.2)
Total Protein: 7.1 g/dL (ref 6.0–8.3)

## 2019-02-10 LAB — CBC
HCT: 43.3 % (ref 39.0–52.0)
Hemoglobin: 14.1 g/dL (ref 13.0–17.0)
MCHC: 32.5 g/dL (ref 30.0–36.0)
MCV: 86.1 fl (ref 78.0–100.0)
Platelets: 280 10*3/uL (ref 150.0–400.0)
RBC: 5.03 Mil/uL (ref 4.22–5.81)
RDW: 14.7 % (ref 11.5–15.5)
WBC: 7.2 10*3/uL (ref 4.0–10.5)

## 2019-02-10 LAB — LIPID PANEL
Cholesterol: 124 mg/dL (ref 0–200)
HDL: 38.3 mg/dL — ABNORMAL LOW (ref >39.00)
NonHDL: 85.76
Total CHOL/HDL Ratio: 3
Triglycerides: 320 mg/dL — ABNORMAL HIGH (ref 0.0–149.0)
VLDL: 64 mg/dL — ABNORMAL HIGH (ref 0.0–40.0)

## 2019-02-10 LAB — HEMOGLOBIN A1C: Hgb A1c MFr Bld: 6.7 % — ABNORMAL HIGH (ref 4.6–6.5)

## 2019-02-10 LAB — TSH: TSH: 1.75 u[IU]/mL (ref 0.35–4.50)

## 2019-02-10 MED ORDER — JARDIANCE 10 MG PO TABS: 10.0000 mg | ORAL_TABLET | Freq: Every day | ORAL | 1 refills | Status: DC

## 2019-02-10 NOTE — Progress Notes (Signed)
Please inform patient of the following:  Cholesterol numbers look great Blood sugar is elevated compared to last year and in diabetic range. Recommend starting Jardiance 10mg  daily to improve numbers. We should recheck in 3-6 months.  All of his other blood work is NORMAL.  Henry Young. [name], MD 02/10/2019 1:09 PM

## 2019-02-11 ENCOUNTER — Telehealth: Payer: Self-pay | Admitting: Family Medicine

## 2019-02-11 NOTE — Telephone Encounter (Signed)
Patient calls in saying that his pharmacy is needing prior authorization for empagliflozin (JARDIANCE) 10 MG TABS tablet, number is [phone].

## 2019-02-12 NOTE — Telephone Encounter (Signed)
Notified patient Rx approved. Approval [id number] 01-13-2019 - 02/12/2020

## 2019-03-03 ENCOUNTER — Other Ambulatory Visit: Payer: Self-pay

## 2019-03-03 MED ORDER — JARDIANCE 10 MG PO TABS: 10.0000 mg | ORAL_TABLET | Freq: Every day | ORAL | 3 refills | Status: AC

## 2019-03-17 ENCOUNTER — Encounter: Payer: Self-pay | Admitting: Family Medicine

## 2019-05-27 ENCOUNTER — Ambulatory Visit: Payer: BC Managed Care – PPO | Admitting: Podiatry

## 2019-05-27 ENCOUNTER — Other Ambulatory Visit: Payer: Self-pay

## 2019-05-27 ENCOUNTER — Ambulatory Visit (INDEPENDENT_AMBULATORY_CARE_PROVIDER_SITE_OTHER): Payer: BC Managed Care – PPO

## 2019-05-27 DIAGNOSIS — M722 Plantar fascial fibromatosis: Secondary | ICD-10-CM | POA: Diagnosis not present

## 2019-05-27 MED ORDER — MELOXICAM 15 MG PO TABS: 15.0000 mg | ORAL_TABLET | Freq: Every day | ORAL | 1 refills | Status: DC

## 2019-05-27 MED ORDER — METHYLPREDNISOLONE 4 MG PO TBPK: ORAL_TABLET | ORAL | 0 refills | Status: DC

## 2019-05-27 MED ORDER — TRAMADOL HCL 50 MG PO TABS: 50.0000 mg | ORAL_TABLET | Freq: Four times a day (QID) | ORAL | 0 refills | Status: AC | PRN

## 2019-05-27 NOTE — Patient Instructions (Signed)
Plantar Fasciitis Rehab Ask your health care provider which exercises are safe for you. Do exercises exactly as told by your health care provider and adjust them as directed. It is normal to feel mild stretching, pulling, tightness, or discomfort as you do these exercises. Stop right away if you feel sudden pain or your pain gets worse. Do not begin these exercises until told by your health care provider. Stretching and range-of-motion exercises These exercises warm up your muscles and joints and improve the movement and flexibility of your foot. These exercises also help to relieve pain. Plantar fascia stretch  1. Sit with your left / right leg crossed over your opposite knee. 2. Hold your heel with one hand with that thumb near your arch. With your other hand, hold your toes and gently pull them back toward the top of your foot. You should feel a stretch on the bottom of your toes or your foot (plantar fascia) or both. 3. Hold this stretch for__________ seconds. 4. Slowly release your toes and return to the starting position. Repeat __________ times. Complete this exercise __________ times a day. Gastrocnemius stretch, standing This exercise is also called a calf (gastroc) stretch. It stretches the muscles in the back of the upper calf. 1. Stand with your hands against a wall. 2. Extend your left / right leg behind you, and bend your front knee slightly. 3. Keeping your heels on the floor and your back knee straight, shift your weight toward the wall. Do not arch your back. You should feel a gentle stretch in your upper left / right calf. 4. Hold this position for __________ seconds. Repeat __________ times. Complete this exercise __________ times a day. Soleus stretch, standing This exercise is also called a calf (soleus) stretch. It stretches the muscles in the back of the lower calf. 1. Stand with your hands against a wall. 2. Extend your left / right leg behind you, and bend your front  knee slightly. 3. Keeping your heels on the floor, bend your back knee and shift your weight slightly over your back leg. You should feel a gentle stretch deep in your lower calf. 4. Hold this position for __________ seconds. Repeat __________ times. Complete this exercise __________ times a day. Gastroc and soleus stretch, standing step This exercise stretches the muscles in the back of the lower leg. These muscles are in the upper calf (gastrocnemius) and the lower calf (soleus). 1. Stand with the ball of your left / right foot on a step. The ball of your foot is on the walking surface, right under your toes. 2. Keep your other foot firmly on the same step. 3. Hold on to the wall or a railing for balance. 4. Slowly lift your other foot, allowing your body weight to press your left / right heel down over the edge of the step. You should feel a stretch in your left / right calf. 5. Hold this position for __________ seconds. 6. Return both feet to the step. 7. Repeat this exercise with a slight bend in your left / right knee. Repeat __________ times with your left / right knee straight and __________ times with your left / right knee bent. Complete this exercise __________ times a day. Balance exercise This exercise builds your balance and strength control of your arch to help take pressure off your plantar fascia. Single leg stand If this exercise is too easy, you can try it with your eyes closed or while standing on a pillow. 1.   Without shoes, stand near a railing or in a doorway. You may hold on to the railing or door frame as needed. 2. Stand on your left / right foot. Keep your big toe down on the floor and try to keep your arch lifted. Do not let your foot roll inward. 3. Hold this position for __________ seconds. Repeat __________ times. Complete this exercise __________ times a day. This information is not intended to replace advice given to you by your health care provider. Make sure  you discuss any questions you have with your health care provider. Document Revised: 04/17/2018 Document Reviewed: 10/23/2017 Elsevier Patient Education  2020 [employer]  

## 2019-05-30 NOTE — Progress Notes (Signed)
   Subjective: 49 year old male presenting today for follow up evaluation of plantar fasciitis of the right foot. He reports sharp, throbbing pain of the right foot that began 2-3 months ago. Wearing steel-toed boots and walking on concrete all day for work increases his pain. He has had relief with injections in the past and is requesting one today. He has not done anything for treatment at home. Patient is here for further evaluation and treatment.   Past Medical History:  Diagnosis Date  . Hypertension      Objective: Physical Exam General: The patient is alert and oriented x3 in no acute distress.  Dermatology: Skin is warm, dry and supple bilateral lower extremities. Negative for open lesions or macerations bilateral.   Vascular: Dorsalis Pedis and Posterior Tibial pulses palpable bilateral.  Capillary fill time is immediate to all digits.  Neurological: Epicritic and protective threshold intact bilateral.   Musculoskeletal: Tenderness to palpation to the plantar aspect of the right heel along the plantar fascia. All other joints range of motion within normal limits bilateral. Strength 5/5 in all groups bilateral.   Radiographic Exam:  Normal osseous mineralization. Joint spaces preserved. No fracture/dislocation/boney destruction.    Assessment: 1. Plantar fasciitis right lateral   Plan of Care:  1. Patient evaluated. X-Rays reviewed.  2. Injection of 0.5cc Celestone soluspan injected into the right plantar fascia  3. Prescription for Medrol Dose Pak provided to patient. 4. Prescription for Meloxicam provided to patient. 5. Prescription for Tramadol provided to patient.  6. Recommended good shoe gear.  7. Return to clinic as needed.     Works at [employer] Going to [place] for a [employer].    Edrick Kins, DPM [facility]  Dr. Edrick Kins, DPM    [address], [place] [address]                  Office [phone]  Fax [phone]

## 2019-06-04 ENCOUNTER — Ambulatory Visit: Payer: BC Managed Care – PPO | Admitting: Neurology

## 2019-06-16 ENCOUNTER — Ambulatory Visit: Payer: BC Managed Care – PPO | Admitting: Neurology

## 2019-07-15 ENCOUNTER — Encounter: Payer: Self-pay | Admitting: Family Medicine

## 2019-07-16 ENCOUNTER — Other Ambulatory Visit: Payer: Self-pay | Admitting: Family Medicine

## 2019-07-22 ENCOUNTER — Other Ambulatory Visit: Payer: Self-pay | Admitting: Family Medicine

## 2019-07-30 ENCOUNTER — Other Ambulatory Visit: Payer: Self-pay

## 2019-07-30 ENCOUNTER — Ambulatory Visit (INDEPENDENT_AMBULATORY_CARE_PROVIDER_SITE_OTHER): Payer: BC Managed Care – PPO | Admitting: Family Medicine

## 2019-07-30 ENCOUNTER — Encounter: Payer: Self-pay | Admitting: Family Medicine

## 2019-07-30 VITALS — BP 121/76 | HR 81 | Temp 98.4°F | Ht 72.0 in | Wt 237.0 lb

## 2019-07-30 DIAGNOSIS — R7303 Prediabetes: Secondary | ICD-10-CM | POA: Diagnosis not present

## 2019-07-30 DIAGNOSIS — E785 Hyperlipidemia, unspecified: Secondary | ICD-10-CM

## 2019-07-30 DIAGNOSIS — R739 Hyperglycemia, unspecified: Secondary | ICD-10-CM

## 2019-07-30 DIAGNOSIS — I10 Essential (primary) hypertension: Secondary | ICD-10-CM | POA: Diagnosis not present

## 2019-07-30 LAB — POCT GLYCOSYLATED HEMOGLOBIN (HGB A1C): Hemoglobin A1C: 6.3 % — AB (ref 4.0–5.6)

## 2019-07-30 NOTE — Progress Notes (Signed)
   Henry Young is a 49 y.o. male who presents today for an office visit.  Assessment/Plan:  Chronic Problems Addressed Today: Prediabetes A1c 6.3 Continue jardiance 10mg  daily. Recheck 6 months. Consider decreasing meds if still at goal.   Dyslipidemia Stable. Continue lipitor 40mg  daily. Recheck lipids at CPE.   Essential hypertension At goal. Continue losartan-hctz 100-25 once daily.     Subjective:  HPI:  See A/p.         Objective:  Physical Exam: BP 121/76   Pulse 81   Temp 98.4 F (36.9 C)   Ht 6' (1.829 m)   Wt (!) 237 lb (107.5 kg)   SpO2 97%   BMI 32.14 kg/m   Wt Readings from Last 3 Encounters:  07/30/19 (!) 237 lb (107.5 kg)  02/09/19 245 lb (111.1 kg)  01/16/19 230 lb (104.3 kg)  Gen: No acute distress, resting comfortably CV: Regular rate and rhythm with no murmurs appreciated Pulm: Normal work of breathing, clear to auscultation bilaterally with no crackles, wheezes, or rhonchi Neuro: Grossly normal, moves all extremities Psych: Normal affect and thought content      Henry Young M. [name], MD 07/30/2019 3:44 PM

## 2019-07-30 NOTE — Assessment & Plan Note (Signed)
A1c 6.3 Continue jardiance 10mg  daily. Recheck 6 months. Consider decreasing meds if still at goal.

## 2019-07-30 NOTE — Patient Instructions (Signed)
It was very nice to see you today!  Your blood sugar looks good.  Keep up the good work!.  Please come back in 6 months for your annual checkup with blood work.  Please come back to see me sooner if needed.  Take care, Dr [name]  Please try these tips to maintain a healthy lifestyle:   Eat at least 3 REAL meals and 1-2 snacks per day.  Aim for no more than 5 hours between eating.  If you eat breakfast, please do so within one hour of getting up.    Each meal should contain half fruits/vegetables, one quarter protein, and one quarter carbs (no bigger than a computer mouse)   Cut down on sweet beverages. This includes juice, soda, and sweet tea.     Drink at least 1 glass of water with each meal and aim for at least 8 glasses per day   Exercise at least 150 minutes every week.

## 2019-07-30 NOTE — Assessment & Plan Note (Signed)
At goal. Continue losartan-hctz 100-25 once daily.

## 2019-07-30 NOTE — Assessment & Plan Note (Signed)
Stable. Continue lipitor 40mg  daily. Recheck lipids at CPE.

## 2019-08-05 ENCOUNTER — Encounter: Payer: Self-pay | Admitting: Family Medicine

## 2019-08-14 ENCOUNTER — Other Ambulatory Visit: Payer: Self-pay | Admitting: Family Medicine

## 2019-08-14 NOTE — Telephone Encounter (Signed)
Refill sent in 3 weeks ago by Yong Channel ok to refill?

## 2019-09-01 ENCOUNTER — Other Ambulatory Visit: Payer: Self-pay

## 2019-09-01 ENCOUNTER — Telehealth: Payer: Self-pay | Admitting: Family Medicine

## 2019-09-01 DIAGNOSIS — Z1211 Encounter for screening for malignant neoplasm of colon: Secondary | ICD-10-CM

## 2019-09-01 NOTE — Telephone Encounter (Signed)
Referral to GI placed for colonoscopy.  

## 2019-09-01 NOTE — Telephone Encounter (Signed)
Patient called in and stated he would like a referral sent for him to get a colonoscopy

## 2019-09-07 ENCOUNTER — Other Ambulatory Visit: Payer: Self-pay | Admitting: Family Medicine

## 2019-09-28 ENCOUNTER — Ambulatory Visit: Payer: BC Managed Care – PPO | Admitting: Physician Assistant

## 2019-09-30 ENCOUNTER — Ambulatory Visit: Payer: BC Managed Care – PPO | Admitting: Family Medicine

## 2019-10-01 ENCOUNTER — Other Ambulatory Visit: Payer: Self-pay

## 2019-10-01 ENCOUNTER — Encounter: Payer: Self-pay | Admitting: Family Medicine

## 2019-10-01 ENCOUNTER — Ambulatory Visit (INDEPENDENT_AMBULATORY_CARE_PROVIDER_SITE_OTHER): Payer: BC Managed Care – PPO | Admitting: Family Medicine

## 2019-10-01 VITALS — BP 120/76 | HR 87 | Ht 72.0 in | Wt 237.2 lb

## 2019-10-01 DIAGNOSIS — S39012A Strain of muscle, fascia and tendon of lower back, initial encounter: Secondary | ICD-10-CM | POA: Diagnosis not present

## 2019-10-01 MED ORDER — TIZANIDINE HCL 4 MG PO TABS
4.0000 mg | ORAL_TABLET | Freq: Three times a day (TID) | ORAL | 1 refills | Status: DC | PRN
Start: 2019-10-01 — End: 2020-04-21

## 2019-10-01 NOTE — Progress Notes (Signed)
    Subjective:    CC: Low back pain  I, Henry Young, LAT, ATC, am serving as scribe for Dr. Lynne Leader.  HPI: Pt is a 49 y/o male presenting w/ c/o low back pain since Monday when he was lifting something heavy at work (70# of shingles).  He has been working through this with pain.  He denies any radiating pain weakness or numbness.  He is tried Tylenol and ibuprofen which helps some.    Radiating pain: No LE numbness/tingling: No LE weakness: No Aggravating factors: Rotation; picking up heavier items Treatments tried: IBU; heat; back brace while at work  Pertinent review of Systems: No fevers chills nausea vomiting or diarrhea.  Relevant historical information: Pretension.  Competitive golfer.   Objective:    Vitals:   10/01/19 1503  BP: 120/76  Pulse: 87  SpO2: 97%   General: Well Developed, well nourished, and in no acute distress.   MSK: L-spine normal-appearing nontender midline.  Tender palpation lumbar paraspinal musculature. Normal lumbar motion however pain with extension rotation and right lateral flexion. Motion me strength reflexes and sensation are intact distally.    Impression and Recommendations:    Assessment and Plan: 49 y.o. male with lumbosacral strain.  No fracture evident on exam.  Plan for physical therapy heating pad TENS unit and intermittent tizanidine.  Patient wishes to continue to work which is reasonable as long as he is feeling okay.  Work note written in case he is unable to work.  Recheck back as needed.Marland Kitchen  PDMP not reviewed this encounter. Orders Placed This Encounter  Procedures  . Ambulatory referral to Physical Therapy    Referral Priority:   Routine    Referral Type:   Physical Medicine    Referral Reason:   Specialty Services Required    Requested Specialty:   Physical Therapy   Meds ordered this encounter  Medications  . tiZANidine (ZANAFLEX) 4 MG tablet    Sig: Take 1 tablet (4 mg total) by mouth every 8 (eight)  hours as needed for muscle spasms.    Dispense:  30 tablet    Refill:  1    Discussed warning signs or symptoms. Please see discharge instructions. Patient expresses understanding.   The above documentation has been reviewed and is accurate and complete Lynne Leader, M.D.

## 2019-10-01 NOTE — Patient Instructions (Signed)
Thank you for coming in today.  I've referred you to Physical Therapy.  Let us know if you don't hear from them in one week.  TENS UNIT: This is helpful for muscle pain and spasm.   Search and Purchase a TENS 7000 2nd edition at  www.tenspros.com or www.[employer].com It should be less than $30.     TENS unit instructions: Do not shower or bathe with the unit on . Turn the unit off before removing electrodes or batteries . If the electrodes lose stickiness add a drop of water to the electrodes after they are disconnected from the unit and place on plastic sheet. If you continued to have difficulty, call the TENS unit company to purchase more electrodes. . Do not apply lotion on the skin area prior to use. Make sure the skin is clean and dry as this will help prolong the life of the electrodes. . After use, always check skin for unusual red areas, rash or other skin difficulties. If there are any skin problems, does not apply electrodes to the same area. . Never remove the electrodes from the unit by pulling the wires. . Do not use the TENS unit or electrodes other than as directed. . Do not change electrode placement without consultating your therapist or physician. Marland Kitchen Keep 2 fingers with between each electrode. . Wear time ratio is 2:1, on to off times.    For example on for 30 minutes off for 15 minutes and then on for 30 minutes off for 15 minutes    Lumbosacral Strain Lumbosacral strain is an injury that causes pain in the lower back (lumbosacral spine). This injury usually happens from overstretching the muscles or ligaments along your spine. Ligaments are cord-like tissues that connect bones to other bones. A strain can affect one or more muscles or ligaments. What are the causes? This condition may be caused by:  A hard, direct hit to the back.  Overstretching the lower back muscles. This may result from: ? A fall. ? Lifting something heavy. ? Repetitive movements such as  bending or crouching. What increases the risk? The following factors may make you more likely to develop this condition:  Participating in sports or activities that involve: ? A sudden twist of the back. ? Pushing or pulling motions.  Being overweight or obese.  Having poor strength and flexibility, especially tight hamstrings or weak muscles in the back or abdomen.  Having too much of a curve in the lower back.  Having a pelvis that is tilted forward. What are the signs or symptoms? The main symptom of this condition is pain in the lower back, at the site of the strain. Pain may also be felt down one or both legs. How is this diagnosed? This condition is diagnosed based on your symptoms, your medical history, and a physical exam. During the physical exam, your health care provider may push on certain areas of your back to find the source of your pain. You may be asked to bend forward, backward, and side to side to check your pain and range of motion. You may also have imaging tests, such as X-rays and an MRI. How is this treated? This condition may be treated by:  Applying heat and cold on the affected area.  Taking medicines to help relieve pain and relax your muscles.  Taking NSAIDs, such as ibuprofen, to help reduce swelling and discomfort.  Doing stretching and strengthening exercises for your lower back. Symptoms usually improve within several  weeks of treatment. However, recovery time varies. When your symptoms improve, gradually return to your normal routine as soon as possible to reduce pain, avoid stiffness, and keep muscle strength. Follow these instructions at home: Medicines  Take over-the-counter and prescription medicines only as told by your health care provider.  Ask your health care provider if the medicine prescribed to you: ? Requires you to avoid driving or using heavy machinery. ? Can cause constipation. You may need to take these actions to prevent or  treat constipation:  Drink enough fluid to keep your urine pale yellow.  Take over-the-counter or prescription medicines.  Eat foods that are high in fiber, such as beans, whole grains, and fresh fruits and vegetables.  Limit foods that are high in fat and processed sugars, such as fried or sweet foods. Managing pain, stiffness, and swelling      If directed, put ice on the injured area. To do this: ? Put ice in a plastic bag. ? Place a towel between your skin and the bag. ? Leave the ice on for 20 minutes, 2-3 times a day.  If directed, apply heat on the affected area as often as told by your health care provider. Use the heat source that your health care provider recommends, such as a moist heat pack or a heating pad. ? Place a towel between your skin and the heat source. ? Leave the heat on for 20-30 minutes. ? Remove the heat if your skin turns bright red. This is especially important if you are unable to feel pain, heat, or cold. You may have a greater risk of getting burned. Activity  Rest as told by your health care provider.  Do not stay in bed. Staying in bed for more than 1-2 days can delay your recovery.  Return to your normal activities as told by your health care provider. Ask your health care provider what activities are safe for you.  Avoid activities that take a lot of energy for as long as told by your health care provider.  Do exercises as told by your health care provider. This includes stretching and strengthening exercises. General instructions  Sit up and stand up straight. Avoid leaning forward when you sit, or hunching over when you stand.  Do not use any products that contain nicotine or tobacco, such as cigarettes, e-cigarettes, and chewing tobacco. If you need help quitting, ask your health care provider.  Keep all follow-up visits as told by your health care provider. This is important. How is this prevented?   Use correct form when playing  sports and lifting heavy objects.  Use good posture when sitting and standing.  Maintain a healthy weight.  Sleep on a mattress with medium firmness to support your back.  Do at least 150 minutes of moderate-intensity exercise each week, such as brisk walking or water aerobics. Try a form of exercise that takes stress off your back, such as swimming or stationary cycling.  Maintain physical fitness, including: ? Strength. ? Flexibility. Contact a health care provider if:  Your back pain does not improve after several weeks of treatment.  Your symptoms get worse. Get help right away if:  Your back pain is severe.  You cannot stand or walk.  You have difficulty controlling when you urinate or when you have a bowel movement.  You feel nauseous or you vomit.  Your feet or legs get very cold, turn pale, or look blue.  You have numbness, tingling, weakness, or problems  using your arms or legs.  You develop any of the following: ? Shortness of breath. ? Dizziness. ? Pain in your legs. ? Weakness in your buttocks or legs. Summary  Lumbosacral strain is an injury that causes pain in the lower back (lumbosacral spine).  This injury usually happens from overstretching the muscles or ligaments along your spine.  This condition may be caused by a direct hit to the lower back or by overstretching the lower back muscles.  Symptoms usually improve within several weeks of treatment. This information is not intended to replace advice given to you by your health care provider. Make sure you discuss any questions you have with your health care provider. Document Revised: 05/20/2018 Document Reviewed: 05/20/2018 Elsevier Patient Education  [place].

## 2019-10-06 ENCOUNTER — Encounter: Payer: Self-pay | Admitting: Family Medicine

## 2019-10-10 ENCOUNTER — Ambulatory Visit (HOSPITAL_COMMUNITY)
Admission: EM | Admit: 2019-10-10 | Discharge: 2019-10-10 | Disposition: A | Discharge status: Home / Self Care | Payer: BC Managed Care – PPO

## 2019-10-10 ENCOUNTER — Other Ambulatory Visit: Payer: Self-pay

## 2019-10-10 ENCOUNTER — Encounter (HOSPITAL_COMMUNITY): Payer: Self-pay

## 2019-10-10 DIAGNOSIS — S39012A Strain of muscle, fascia and tendon of lower back, initial encounter: Secondary | ICD-10-CM | POA: Diagnosis not present

## 2019-10-10 NOTE — ED Provider Notes (Signed)
[facility]    CSN: [id number] Arrival date & time: 10/10/19  1717      History   Chief Complaint Chief Complaint  Patient presents with  . Back Pain    HPI Henry Young is a 49 y.o. male.   Patient here c/w back pain x 1 week.  Started Monday while lifting heavy object at work, saw PCP who started him on muscle relaxer.  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  He is under the care of PCP for this.  Pain worse this morning.  Radiates posterior R leg, admits RROM, tenderness. Denies n/t, weakness.     Past Medical History:  Diagnosis Date  . Hypertension     Patient Active Problem List   Diagnosis Date Noted  . Nonintractable headache 02/02/2019  . 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 Chronic left shoulder pain 03/24/2018  . Dyslipidemia 02/11/2018  . Prediabetes 02/11/2018  . Essential hypertension 02/06/2018    History reviewed. No pertinent surgical history.     Home Medications    Prior to Admission medications   Medication Sig Start Date End Date Taking? Authorizing Provider  atorvastatin (LIPITOR) 40 MG tablet Take 1 tablet (40 mg total) by mouth daily. 08/22/18   Vivi Barrack, MD  ibuprofen (ADVIL) 800 MG tablet TAKE 1 TABLET BY MOUTH EVERY 8 HOURS AS NEEDED 09/07/19   Vivi Barrack, MD  JARDIANCE 10 MG TABS tablet Take 10 mg by mouth every morning. 06/11/19   [provider]  losartan-hydrochlorothiazide (HYZAAR) 100-25 MG tablet Take 1 tablet by mouth daily. 08/14/18   Vivi Barrack, MD  meloxicam (MOBIC) 15 MG tablet Take 1 tablet (15 mg total) by mouth daily. Patient not taking: Reported on 10/01/2019 05/27/19   Edrick Kins, DPM  tiZANidine (ZANAFLEX) 4 MG tablet Take 1 tablet (4 mg total) by mouth every 8 (eight) hours as needed for muscle spasms. 10/01/19   Gregor Hams, MD    Family History Family History  Problem Relation Age of Onset  . Heart disease Mother   .  Prostate cancer Neg Hx   . Colon cancer Neg Hx     Social History Social History   Tobacco Use  . Smoking status: Never Smoker  . Smokeless tobacco: Never Used  Vaping Use  . Vaping Use: Never used  Substance Use Topics  . Alcohol use: Yes  . Drug use: Not on file     Allergies   Patient has no known allergies.   Review of Systems Review of Systems  Constitutional: Negative for chills, fatigue and fever.  Gastrointestinal: Negative for abdominal pain, constipation and diarrhea.  Genitourinary: Negative for decreased urine volume, difficulty urinating and urgency.  Musculoskeletal: Positive for arthralgias, back pain and myalgias. Negative for neck pain and neck stiffness.  Skin: Negative for color change.  Hematological: Negative for adenopathy. Does not bruise/bleed easily.  Psychiatric/Behavioral: Negative for agitation.     Physical Exam Triage Vital Signs ED Triage Vitals  Enc Vitals Group     BP 10/10/19 1805 131/80     Pulse Rate 10/10/19 1805 85     Resp 10/10/19 1805 16     Temp 10/10/19 1805 98.6 F (37 C)     Temp Source 10/10/19 1805 Oral     SpO2 10/10/19 1805 100 %     Weight --      Height --  Head Circumference --      Peak Flow --      Pain Score 10/10/19 1806 10     Pain Loc --      Pain Edu? --      Excl. in [place]? --    No data found.  Updated Vital Signs BP 131/80 (BP Location: Right Arm)   Pulse 85   Temp 98.6 F (37 C) (Oral)   Resp 16   SpO2 100%   Visual Acuity Right Eye Distance:   Left Eye Distance:   Bilateral Distance:    Right Eye Near:   Left Eye Near:    Bilateral Near:     Physical Exam Vitals and nursing note reviewed.  Constitutional:      Appearance: Normal appearance. He is well-developed.  HENT:     Head: Normocephalic and atraumatic.     Nose: Nose normal.     Mouth/Throat:     Mouth: Mucous membranes are moist.  Eyes:     General: No scleral icterus.    Conjunctiva/sclera: Conjunctivae normal.    Pulmonary:     Effort: Pulmonary effort is normal. No respiratory distress.  Musculoskeletal:     Cervical back: Normal range of motion and neck supple. No rigidity.     Lumbar back: Spasms, tenderness and bony tenderness present. Decreased range of motion.       Back:     Comments: Using wheelchair to ambulate  Skin:    General: Skin is warm and dry.     Capillary Refill: Capillary refill takes less than 2 seconds.  Neurological:     General: No focal deficit present.     Mental Status: He is alert and oriented to person, place, and time.     Gait: Gait normal.  Psychiatric:        Mood and Affect: Mood normal.      UC Treatments / Results  Labs (all labs ordered are listed, but only abnormal results are displayed) Labs Reviewed - No data to display  EKG   Radiology No results found.  Procedures Procedures (including critical care time)  Medications Ordered in UC Medications - No data to display  Initial Impression / Assessment and Plan / UC Course  I have reviewed the triage vital signs and the nursing notes.  Pertinent labs & imaging results that were available during my care of the patient were reviewed by me and considered in my medical decision making (see chart for details).     Follow up with ortho Go to ED with worsening symptoms.  Take medication as prescribed by PCP Apply ice and heat 15 minutes 4 times per day to back  Final Clinical Impressions(s) / UC Diagnoses   Final diagnoses:  Strain of lumbar region, initial encounter     Discharge Instructions     Follow up with ortho as discussed Take medications as prescribed. Apply ice and heat 15 minutes 4 times per day.     ED Prescriptions    None     PDMP not reviewed this encounter.   Peri Jefferson, PA-C 10/10/19 1847

## 2019-10-10 NOTE — Discharge Instructions (Signed)
Follow up with ortho as discussed Take medications as prescribed. Apply ice and heat 15 minutes 4 times per day.

## 2019-10-10 NOTE — ED Triage Notes (Signed)
Pt present back pain from heavy lifting at work. Symptoms started yesterday while at work.

## 2019-10-12 ENCOUNTER — Encounter (HOSPITAL_COMMUNITY): Payer: Self-pay | Admitting: *Deleted

## 2019-10-12 ENCOUNTER — Emergency Department (HOSPITAL_COMMUNITY): Payer: Worker's Compensation

## 2019-10-12 ENCOUNTER — Other Ambulatory Visit: Payer: Self-pay

## 2019-10-12 ENCOUNTER — Other Ambulatory Visit: Payer: Self-pay | Admitting: Family Medicine

## 2019-10-12 ENCOUNTER — Emergency Department (HOSPITAL_COMMUNITY)
Admission: EM | Admit: 2019-10-12 | Discharge: 2019-10-12 | Disposition: A | Discharge status: Home / Self Care | Payer: Worker's Compensation | Attending: Emergency Medicine | Admitting: Emergency Medicine

## 2019-10-12 DIAGNOSIS — X500XXA Overexertion from strenuous movement or load, initial encounter: Secondary | ICD-10-CM | POA: Diagnosis not present

## 2019-10-12 DIAGNOSIS — I10 Essential (primary) hypertension: Secondary | ICD-10-CM | POA: Insufficient documentation

## 2019-10-12 DIAGNOSIS — S34109A Unspecified injury to unspecified level of lumbar spinal cord, initial encounter: Secondary | ICD-10-CM | POA: Diagnosis present

## 2019-10-12 DIAGNOSIS — Z79899 Other long term (current) drug therapy: Secondary | ICD-10-CM | POA: Insufficient documentation

## 2019-10-12 DIAGNOSIS — S39012A Strain of muscle, fascia and tendon of lower back, initial encounter: Secondary | ICD-10-CM

## 2019-10-12 MED ORDER — DEXAMETHASONE SODIUM PHOSPHATE 10 MG/ML IJ SOLN
10.0000 mg | Freq: Once | INTRAMUSCULAR | Status: AC
  Administered 2019-10-12: 10 mg via INTRAMUSCULAR
  Filled 2019-10-12: qty 1

## 2019-10-12 MED ORDER — KETOROLAC TROMETHAMINE 60 MG/2ML IM SOLN
60.0000 mg | Freq: Once | INTRAMUSCULAR | Status: AC
  Administered 2019-10-12: 60 mg via INTRAMUSCULAR
  Filled 2019-10-12: qty 2

## 2019-10-12 NOTE — ED Provider Notes (Signed)
[facility] DEPT Provider Note   CSN: [id number] Arrival date & time: 10/12/19  1034     History Chief Complaint  Patient presents with  . Back Pain    Henry Young is a 49 y.o. male.  The history is provided by the patient and medical records. No language interpreter was used.  Back Pain Location:  Lumbar spine Quality:  Aching Radiates to:  L thigh and R thigh Pain severity:  Severe Pain is:  Worse during the day Onset quality:  Gradual Duration:  5 days Timing:  Constant Progression:  Worsening Chronicity:  New Context: lifting heavy objects   Relieved by:  Being still Worsened by:  Ambulation, bending, twisting, sitting and standing Ineffective treatments:  Bed rest, cold packs, heating pad, ibuprofen and muscle relaxants Associated symptoms: no abdominal pain, no abdominal swelling, no bladder incontinence, no bowel incontinence, no chest pain, no dysuria, no fever, no headaches, no leg pain, no numbness, no pelvic pain, no perianal numbness, no tingling and no weakness   Risk factors: obesity   Risk factors: no hx of cancer, no hx of osteoporosis and no recent surgery        Past Medical History:  Diagnosis Date  . Hypertension     Patient Active Problem List   Diagnosis Date Noted  . Nonintractable headache 02/02/2019  . 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 Chronic left shoulder pain 03/24/2018  . Dyslipidemia 02/11/2018  . Prediabetes 02/11/2018  . Essential hypertension 02/06/2018    History reviewed. No pertinent surgical history.     Family History  Problem Relation Age of Onset  . Heart disease Mother   . Prostate cancer Neg Hx   . Colon cancer Neg Hx     Social History   Tobacco Use  . Smoking status: Never Smoker  . Smokeless tobacco: Never Used  Vaping Use  . Vaping Use: Never used  Substance Use Topics  . Alcohol use: Yes  . Drug use: Not on file    Home Medications Prior to Admission medications    Medication Sig Start Date End Date Taking? Authorizing Provider  atorvastatin (LIPITOR) 40 MG tablet Take 1 tablet (40 mg total) by mouth daily. 08/22/18   Vivi Barrack, MD  ibuprofen (ADVIL) 800 MG tablet TAKE 1 TABLET BY MOUTH EVERY 8 HOURS AS NEEDED 09/07/19   Vivi Barrack, MD  JARDIANCE 10 MG TABS tablet Take 10 mg by mouth every morning. 06/11/19   [provider]  losartan-hydrochlorothiazide (HYZAAR) 100-25 MG tablet TAKE 1 TABLET BY MOUTH EVERY DAY 10/12/19   Vivi Barrack, MD  meloxicam (MOBIC) 15 MG tablet Take 1 tablet (15 mg total) by mouth daily. Patient not taking: Reported on 10/01/2019 05/27/19   Edrick Kins, DPM  tiZANidine (ZANAFLEX) 4 MG tablet Take 1 tablet (4 mg total) by mouth every 8 (eight) hours as needed for muscle spasms. 10/01/19   Gregor Hams, MD    Allergies    Patient has no known allergies.  Review of Systems   Review of Systems  Constitutional: Negative for fever.  Cardiovascular: Negative for chest pain.  Gastrointestinal: Negative for abdominal pain and bowel incontinence.  Genitourinary: Negative for bladder incontinence, dysuria and pelvic pain.  Musculoskeletal: Positive for back pain.  Neurological: Negative for tingling, weakness, numbness and headaches.    Physical Exam Updated Vital Signs BP (!) 133/97 (BP Location: Right Arm)   Pulse 87   Temp 98.8 F (37.1 C) (  Oral)   Resp 16   Ht 6' (1.829 m)   Wt 107 kg   SpO2 98%   BMI 31.99 kg/m   Physical Exam Vitals and nursing note reviewed.  Constitutional:      General: He is not in acute distress.    Appearance: He is well-developed. He is not diaphoretic.  HENT:     Head: Normocephalic and atraumatic.  Eyes:     General: No scleral icterus.    Conjunctiva/sclera: Conjunctivae normal.  Cardiovascular:     Rate and Rhythm: Normal rate and regular rhythm.     Heart sounds: Normal heart sounds.  Pulmonary:     Effort: Pulmonary effort is normal. No respiratory  distress.     Breath sounds: Normal breath sounds.  Abdominal:     Palpations: Abdomen is soft.     Tenderness: There is no abdominal tenderness.  Musculoskeletal:     Cervical back: Normal range of motion and neck supple.     Comments: Patient appears to be in mild to moderate pain, antalgic gait noted. Lumbosacral spine area reveals no local tenderness or mass. Painful and reduced LS ROM noted. Straight leg raise is negative. DTR's, motor strength and sensation normal, including heel and toe gait.  Peripheral pulses are palpable.   Skin:    General: Skin is warm and dry.  Neurological:     Mental Status: He is alert.  Psychiatric:        Behavior: Behavior normal.     ED Results / Procedures / Treatments   Labs (all labs ordered are listed, but only abnormal results are displayed) Labs Reviewed - No data to display  EKG None  Radiology No results found.  Procedures Procedures (including critical care time)  Medications Ordered in ED Medications - No data to display  ED Course  I have reviewed the triage vital signs and the nursing notes.  Pertinent labs & imaging results that were available during my care of the patient were reviewed by me and considered in my medical decision making (see chart for details).    MDM Rules/Calculators/A&P                         Patient with back pain.  No neurological deficits and normal neuro exam.  Patient can walk but states is painful.  No loss of bowel or bladder control.   I reviewed images of lumbar film which shows loss of lordotic curve consistent with spasm. No concern for cauda equina.  No fever, night sweats, weight loss, h/o cancer, IVDU.  RICE protocol and pain medicine indicated and discussed with patient.   Final Clinical Impression(s) / ED Diagnoses Final diagnoses:  Strain of lumbar region, initial encounter    Rx / DC Orders ED Discharge Orders    None       Margarita Mail, PA-C 10/15/19 1007      Lucrezia Starch, MD 10/16/19 1506

## 2019-10-12 NOTE — ED Triage Notes (Signed)
Pt presents with back pain that began after pt was lifting 70lbs at work.  Pt reports bilateral lower back pain that radiates to leg.  Pt denies loss of control to bowels.  Pt reports pain is worse with movement.  Pt has taken NSAIDs and muscle relaxer without much improvement.  Pt reports walking with a walker and able to bear weight on feet. Pt a/o x 4.

## 2019-10-12 NOTE — Discharge Instructions (Signed)
SEEK IMMEDIATE MEDICAL ATTENTION IF: New numbness, tingling, weakness, or problem with the use of your arms or legs.  Severe back pain not relieved with medications.  Change in bowel or bladder control.  Increasing pain in any areas of the body (such as chest or abdominal pain).  Shortness of breath, dizziness or fainting.  Nausea (feeling sick to your stomach), vomiting, fever, or sweats.  

## 2019-10-13 ENCOUNTER — Other Ambulatory Visit: Payer: Self-pay | Admitting: Family Medicine

## 2019-10-19 ENCOUNTER — Encounter: Payer: Self-pay | Admitting: Family Medicine

## 2019-10-19 ENCOUNTER — Other Ambulatory Visit: Payer: Self-pay

## 2019-10-19 ENCOUNTER — Ambulatory Visit (INDEPENDENT_AMBULATORY_CARE_PROVIDER_SITE_OTHER): Payer: BC Managed Care – PPO | Admitting: Family Medicine

## 2019-10-19 VITALS — BP 100/70 | HR 62 | Ht 72.0 in | Wt 238.0 lb

## 2019-10-19 DIAGNOSIS — M5442 Lumbago with sciatica, left side: Secondary | ICD-10-CM | POA: Diagnosis not present

## 2019-10-19 MED ORDER — PREDNISONE 50 MG PO TABS: 50.0000 mg | ORAL_TABLET | Freq: Every day | ORAL | 0 refills | Status: DC

## 2019-10-19 MED ORDER — GABAPENTIN 300 MG PO CAPS: 300.0000 mg | ORAL_CAPSULE | Freq: Three times a day (TID) | ORAL | 3 refills | Status: DC | PRN

## 2019-10-19 NOTE — Patient Instructions (Addendum)
Thank you for coming in today.  Take the gabapentin up to 3x daily for nerve pain as needed.  Take the prednisone daily for 5 days.   I am planning for MRI.  [facility] may also do the MRI.

## 2019-10-19 NOTE — Progress Notes (Signed)
I Henry Young am serving as a Education administrator for Dr. Lynne Leader.   Henry Young is a 49 y.o. male who presents to [facility] at [facility] today for follow up of back pain. Patient was last seen by Dr. Georgina Snell on 10/01/2019 for  lumbosacral strain.    His pain started after he lifted a heavy object weighing approximately 70 pounds at work.  Injury occurred on or around September 20.  No fracture evident on exam.  Plan for physical therapy heating pad TENS unit and intermittent tizanidine. Patient has not started PT but is scheduled to start tomorrow. Since last visit patient states that he is having sharp pains running down his ledt side and leg and are getting worse.  Patient notes that his pain radiating down his left leg is worse than his back.  Pain is located in the buttocks lateral thigh to the lateral calf.  He is developing a little bit of weakness when he walks.  No bowel or bladder dysfunction.  Since his last visit Worker's Comp. is gotten involved and he has an appointment scheduled with Dr. Lynann Bologna for Tuesday the 19th  Pertinent review of systems: No fevers or chills  Relevant historical information: Hypertension   Exam:  BP 100/70 (BP Location: Left Arm, Patient Position: Sitting)   Pulse 62   Ht 6' (1.829 m)   Wt 238 lb (108 kg)   SpO2 98%   BMI 32.28 kg/m  General: Well Developed, well nourished, and in no acute distress.   MSK: L-spine normal-appearing nontender midline.  Decreased lumbar motion. Lower extremity strength is intact to resisted exam testing today. Positive left-sided slump test. Reflexes intact. Sensation is intact distally.    Lab and Radiology Results DG Lumbar Spine Complete  Result Date: 10/12/2019 CLINICAL DATA:  Low back pain radiating into both legs. EXAM: LUMBAR SPINE - COMPLETE 4+ VIEW COMPARISON:  None. FINDINGS: Vertebral body height and alignment are maintained. Intervertebral disc space height is normal. Mild anterior  endplate spurring lower lumbar spine noted. Paraspinous structures unremarkable. IMPRESSION: No acute abnormality. Very mild appearing degenerative disease noted. Electronically Signed   By: Inge Rise M.D.   On: 10/12/2019 13:07  I, Lynne Leader, personally (independently) visualized and performed the interpretation of the images attached in this note.\      Assessment and Plan: 49 y.o. male with back pain with new lumbar radiculopathy.  Patient is having worsening progressive neurological symptoms and will benefit from an MRI to further characterize them.  In the meantime we will proceed with trial of prednisone and gabapentin.  Have ordered MRI now to be done later this week or next week.  He has an appointment already scheduled for Worker's Comp. with Dr. Lynann Bologna next week.  Is possible that his care may be transition to that per [employer]   PDMP not reviewed this encounter. Orders Placed This Encounter  Procedures  . MR Lumbar Spine Wo Contrast    Standing Status:   Future    Standing Expiration Date:   10/18/2020    Order Specific Question:   What is the patient's sedation requirement?    Answer:   No Sedation    Order Specific Question:   Does the patient have a pacemaker or implanted devices?    Answer:   No    Order Specific Question:   Preferred imaging location?    Answer:   Product/process development scientist (table limit-350lbs)   Meds ordered this encounter  Medications  . predniSONE (DELTASONE) 50 MG tablet    Sig: Take 1 tablet (50 mg total) by mouth daily.    Dispense:  5 tablet    Refill:  0  . gabapentin (NEURONTIN) 300 MG capsule    Sig: Take 1 capsule (300 mg total) by mouth 3 (three) times daily as needed.    Dispense:  90 capsule    Refill:  3     Discussed warning signs or symptoms. Please see discharge instructions. Patient expresses understanding.   The above documentation has been reviewed and is accurate and complete Lynne Leader, M.D.

## 2019-10-20 ENCOUNTER — Ambulatory Visit: Payer: BC Managed Care – PPO | Admitting: Physical Therapy

## 2019-10-20 ENCOUNTER — Telehealth: Payer: Self-pay | Admitting: Family Medicine

## 2019-10-20 NOTE — Telephone Encounter (Signed)
Order location changed to [place] due to [place] being OON.

## 2019-10-20 NOTE — Addendum Note (Signed)
Addended by: Douglass Rivers T on: 10/20/2019 05:35 AM   Modules accepted: Orders

## 2019-10-20 NOTE — Telephone Encounter (Signed)
MRI was ordered to [facility] in [place].  We will pre-CERT this and should be able to get done in [place] this week or early next week.

## 2019-10-20 NOTE — Telephone Encounter (Signed)
Updated information.  [facility] out of network.  We will switch order to stat for [facility] location.

## 2019-10-20 NOTE — Telephone Encounter (Signed)
Pt called, was seen yesterday. [facility] called and cannot get him in until November 1. Patient insists that Dr. Georgina Snell said he would get him in somewhere this week. [place] advised patient that order should have been sent as "stat" and not routine, but still could not guarantee getting him in much sooner. Should this be stat? Should we consider another location?

## 2019-10-21 ENCOUNTER — Ambulatory Visit: Payer: BC Managed Care – PPO | Admitting: Physical Therapy

## 2019-10-28 NOTE — Progress Notes (Signed)
NEUROLOGY FOLLOW UP OFFICE NOTE  Henry Young [id number]  HISTORY OF PRESENT ILLNESS: Henry Young is a 49 year old male who follows up for headache and left sided cervical radiculopathy.  UPDATE: Last seen in January.  Left sided arm pain has resolved.  Headaches are controlled.    He is currently being treated for low back pain with lumbar radiculopathy.   HISTORY: He has history of headaches on and off in the past..  He started having  headaches again in November.  No preceding trauma, new medication, or illness.  Blood pressure had been normal.  He received a headache cocktail by his PCP in early December, which was ineffective.  Then about a month ago, he started experiencing numbness down the left shoulder and arm which would make him up at night.  Headaches are moderate non-throbbing bi-temporal and retro-orbital pain.  No aggravating or relieving factors.  They initially were persistent but over the past 2 weeks occur briefly once or maybe twice a day, lasting about 30 minutes.  No associated nausea, vomiting, photophobia, phonophobia, osmophobia, autonomic symptoms or visual disturbance.  He points to increased emotional stress as a possible trigger.  He also had been sleeping on the couch and noted a crick in his neck.   Past NSAIDS:  none Past analgesics:  tramadol Past abortive triptans:  none Past abortive ergotamine:  none Past muscle relaxants:  none Past anti-emetic:  none Past antihypertensive medications:  Propranolol ER 80mg ; Toprol XL Past antidepressant medications:  none Past anticonvulsant medications:  none Past anti-CGRP:  none Past vitamins/Herbal/Supplements:  none Past antihistamines/decongestants:  none Other past therapies:  none  PAST MEDICAL HISTORY: Past Medical History:  Diagnosis Date  . Hypertension     MEDICATIONS: Current Outpatient Medications on File Prior to Visit  Medication Sig Dispense Refill  . atorvastatin  (LIPITOR) 40 MG tablet TAKE 1 TABLET BY MOUTH EVERY DAY 90 tablet 3  . gabapentin (NEURONTIN) 300 MG capsule Take 1 capsule (300 mg total) by mouth 3 (three) times daily as needed. 90 capsule 3  . ibuprofen (ADVIL) 800 MG tablet TAKE 1 TABLET BY MOUTH EVERY 8 HOURS AS NEEDED 90 tablet 0  . JARDIANCE 10 MG TABS tablet Take 10 mg by mouth every morning.    Marland Kitchen losartan-hydrochlorothiazide (HYZAAR) 100-25 MG tablet TAKE 1 TABLET BY MOUTH EVERY DAY 90 tablet 3  . meloxicam (MOBIC) 15 MG tablet Take 1 tablet (15 mg total) by mouth daily. 30 tablet 1  . predniSONE (DELTASONE) 50 MG tablet Take 1 tablet (50 mg total) by mouth daily. 5 tablet 0  . tiZANidine (ZANAFLEX) 4 MG tablet Take 1 tablet (4 mg total) by mouth every 8 (eight) hours as needed for muscle spasms. 30 tablet 1   No current facility-administered medications on file prior to visit.    ALLERGIES: No Known Allergies  FAMILY HISTORY: Family History  Problem Relation Age of Onset  . Heart disease Mother   . Prostate cancer Neg Hx   . Colon cancer Neg Hx    SOCIAL HISTORY: Social History   Socioeconomic History  . Marital status: Single    Spouse name: Not on file  . Number of children: 8  . Years of education: 80  . Highest education level: Not on file  Occupational History  . Occupation: [employer]   Tobacco Use  . Smoking status: Never Smoker  . Smokeless tobacco: Never Used  Vaping Use  . Vaping Use: Never  used  Substance and Sexual Activity  . Alcohol use: Yes  . Drug use: Not on file  . Sexual activity: Yes  Other Topics Concern  . Not on file  Social History Narrative   Right handed   One story home   Social Determinants of Health   Financial Resource Strain:   . Difficulty of Paying Living Expenses: Not on file  Food Insecurity:   . Worried About Charity fundraiser in the Last Year: Not on file  . Ran Out of Food in the Last Year: Not on file  Transportation Needs:   . Lack of Transportation  (Medical): Not on file  . Lack of Transportation (Non-Medical): Not on file  Physical Activity:   . Days of Exercise per Week: Not on file  . Minutes of Exercise per Session: Not on file  Stress:   . Feeling of Stress : Not on file  Social Connections:   . Frequency of Communication with Friends and Family: Not on file  . Frequency of Social Gatherings with Friends and Family: Not on file  . Attends Religious Services: Not on file  . Active Member of Clubs or Organizations: Not on file  . Attends Archivist Meetings: Not on file  . Marital Status: Not on file  Intimate Partner Violence:   . Fear of Current or Ex-Partner: Not on file  . Emotionally Abused: Not on file  . Physically Abused: Not on file  . Sexually Abused: Not on file   PHYSICAL EXAM: Blood pressure (!) 145/105, pulse (!) 110, height 6\' 1"  (1.854 m), weight 228 lb 12.8 oz (103.8 kg), SpO2 95 %. General: No acute distress.  Patient appears well-groomed.   Head:  Normocephalic/atraumatic Eyes:  Fundi examined but not visualized Neck: supple, no paraspinal tenderness, full range of motion Heart:  Regular rate and rhythm Lungs:  Clear to auscultation bilaterally Back: No paraspinal tenderness Neurological Exam: alert and oriented to person, place, and time. Attention span and concentration intact, recent and remote memory intact, fund of knowledge intact.  Speech fluent and not dysarthric, language intact.  CN II-XII intact. Bulk and tone normal, muscle strength 5/5 throughout.  Sensation to light touch, temperature and vibration intact.  Deep tendon reflexes 2+ throughout, toes downgoing.  Finger to nose and heel to shin testing intact.  Gait normal, Romberg negative.  IMPRESSION: Tension-type headache, resolved Elevated blood pressure  PLAN: Follow up as needed Follow up with PCP regarding blood pressure  Metta Clines, DO  CC: Dimas Chyle, MD

## 2019-10-29 ENCOUNTER — Other Ambulatory Visit: Payer: Self-pay

## 2019-10-29 ENCOUNTER — Encounter: Payer: Self-pay | Admitting: Neurology

## 2019-10-29 ENCOUNTER — Ambulatory Visit (INDEPENDENT_AMBULATORY_CARE_PROVIDER_SITE_OTHER): Payer: BC Managed Care – PPO | Admitting: Neurology

## 2019-10-29 VITALS — BP 145/105 | HR 110 | Ht 73.0 in | Wt 228.8 lb

## 2019-10-29 DIAGNOSIS — I10 Essential (primary) hypertension: Secondary | ICD-10-CM | POA: Diagnosis not present

## 2019-10-29 DIAGNOSIS — R519 Headache, unspecified: Secondary | ICD-10-CM

## 2019-10-29 NOTE — Patient Instructions (Signed)
Follow up as needed

## 2019-11-03 ENCOUNTER — Ambulatory Visit: Payer: BC Managed Care – PPO | Admitting: Physical Therapy

## 2019-11-09 ENCOUNTER — Telehealth: Payer: Self-pay | Admitting: Family Medicine

## 2019-11-09 NOTE — Telephone Encounter (Signed)
Pt is having difficulty with his Workers Comp. Apparently during his last visit, Dr. Georgina Snell spoke to his [facility] representative. This rep is stating that Dr. Georgina Snell advised pt injury was NOT work related. Pt remembers this differently.  Pt wanted to speak with Dr. Georgina Snell, but I think he is looking for something in writing regarding Dr. Clovis Riley opinion of this injury being work related or not.

## 2019-11-10 NOTE — Telephone Encounter (Signed)
Patient called back following up on this.  Please advise.

## 2019-11-12 ENCOUNTER — Encounter: Payer: Self-pay | Admitting: Family Medicine

## 2019-11-12 NOTE — Telephone Encounter (Signed)
Almyra Free contacted patient about letter he will pick up today

## 2019-11-12 NOTE — Telephone Encounter (Signed)
I have written a letter stating that your pain is related to work injury.  Letter was sent through my chart.  Additionally a copy of the letter is printed and ready to be picked up at the front desk.

## 2019-12-07 ENCOUNTER — Telehealth: Payer: Self-pay

## 2019-12-07 ENCOUNTER — Encounter: Payer: Self-pay | Admitting: Gastroenterology

## 2019-12-07 NOTE — Telephone Encounter (Signed)
Left message to return call to our office at their convenience.  

## 2019-12-07 NOTE — Telephone Encounter (Signed)
Pt called and relayed msg below and gave him [facility] number.

## 2019-12-07 NOTE — Telephone Encounter (Signed)
Pt is wanting his prostrate checked an a colonoscopy as he states " Im getting about that age.". Told pt I would let Dr. [name] know and get back to him with the next steps. I was unaware of next steps.

## 2019-12-07 NOTE — Telephone Encounter (Signed)
Please advise 

## 2019-12-07 NOTE — Telephone Encounter (Signed)
Prostate has been checked last two years and has been normal. He needs to call to get colonoscopy scheduled - referral has already been placed.   Algis Greenhouse. [name], MD 12/07/2019 2:58 PM

## 2019-12-16 ENCOUNTER — Telehealth: Payer: Self-pay

## 2019-12-16 NOTE — Telephone Encounter (Signed)
MEDICATION: metroNIDAZOLE 500 mg Oral 2 times daily    PHARMACY:  [id number] [place], [place] - [address] Phone:  [phone]  Fax:  [phone]-       Comments: Patient stated he has gotten this before for the same problem and would like it sent in again.   **Let patient know to contact pharmacy at the end of the day to make sure medication is ready. **  ** Please notify patient to allow 48-72 hours to process**  **Encourage patient to contact the pharmacy for refills or they can request refills through [facility]**

## 2019-12-16 NOTE — Telephone Encounter (Signed)
Ok to refill 

## 2019-12-17 ENCOUNTER — Other Ambulatory Visit: Payer: Self-pay

## 2019-12-17 ENCOUNTER — Encounter: Payer: Self-pay | Admitting: Family Medicine

## 2019-12-17 NOTE — Telephone Encounter (Signed)
Please clarify with patient. Last time we prescribed this was due to trichomonas exposure. If this is the case, then it is fine to send in. Would like for him to come in for STD panel if possible.  Algis Greenhouse. [name], MD 12/17/2019 8:07 AM

## 2019-12-18 MED ORDER — METRONIDAZOLE 500 MG PO TABS: 500.0000 mg | ORAL_TABLET | Freq: Two times a day (BID) | ORAL | 0 refills | Status: AC

## 2019-12-18 NOTE — Telephone Encounter (Signed)
See mychart documentation

## 2020-01-04 ENCOUNTER — Other Ambulatory Visit: Payer: BC Managed Care – PPO

## 2020-01-04 DIAGNOSIS — Z20822 Contact with and (suspected) exposure to covid-19: Secondary | ICD-10-CM | POA: Diagnosis not present

## 2020-01-05 ENCOUNTER — Encounter: Payer: Self-pay | Admitting: Family Medicine

## 2020-01-05 ENCOUNTER — Telehealth (INDEPENDENT_AMBULATORY_CARE_PROVIDER_SITE_OTHER): Payer: BC Managed Care – PPO | Admitting: Family Medicine

## 2020-01-05 DIAGNOSIS — G4700 Insomnia, unspecified: Secondary | ICD-10-CM | POA: Insufficient documentation

## 2020-01-05 DIAGNOSIS — R7303 Prediabetes: Secondary | ICD-10-CM | POA: Diagnosis not present

## 2020-01-05 LAB — SARS-COV-2, NAA 2 DAY TAT

## 2020-01-05 LAB — NOVEL CORONAVIRUS, NAA: SARS-CoV-2, NAA: NOT DETECTED

## 2020-01-05 MED ORDER — TRAZODONE HCL 50 MG PO TABS: 25.0000 mg | ORAL_TABLET | Freq: Every evening | ORAL | 3 refills | Status: DC | PRN

## 2020-01-05 NOTE — Progress Notes (Signed)
   Henry Young is a 49 y.o. male who presents today for a virtual office visit.  Assessment/Plan:  Chronic Problems Addressed Today: Insomnia Not controlled.  Likely exacerbated by recent back injury.  Will start low-dose trazodone.  He has failed melatonin.  He will check with me in a couple of weeks.  Prediabetes Tolerating medications well.  He will come back soon for CPE and we can recheck A1c at that time.     Subjective:  HPI:  Patient here with worsening insomnia for the past several months.  He recently injured his back at work accident.  He has been out of work for the past 2 months and thinks that this is normal for sleep schedule.  Typically has trouble falling asleep.  He will sometimes wake up in the middle of night and not able to go back to sleep.  He has tried melatonin with no improvement.  Is also been on Flexeril which has not helped.         Objective/Observations  Physical Exam: Gen: NAD, resting comfortably Pulm: Normal work of breathing Neuro: Grossly normal, moves all extremities Psych: Normal affect and thought content  Virtual Visit via Video   I connected with Henry Young on 01/05/20 at 11:40 AM EST by a video enabled telemedicine application and verified that I am speaking with the correct person using two identifiers. The limitations of evaluation and management by telemedicine and the availability of in person appointments were discussed. The patient expressed understanding and agreed to proceed.   Patient location: Home Provider location: [place] [facility] [facility] Persons participating in the virtual visit: Myself and Patient     Katina Degree. Jimmey Ralph, MD 01/05/2020 11:59 AM

## 2020-01-05 NOTE — Assessment & Plan Note (Signed)
Tolerating medications well.  He will come back soon for CPE and we can recheck A1c at that time.

## 2020-01-05 NOTE — Assessment & Plan Note (Signed)
Not controlled.  Likely exacerbated by recent back injury.  Will start low-dose trazodone.  He has failed melatonin.  He will check with me in a couple of weeks.

## 2020-01-19 ENCOUNTER — Other Ambulatory Visit: Payer: Self-pay

## 2020-01-19 ENCOUNTER — Ambulatory Visit (AMBULATORY_SURGERY_CENTER): Payer: Self-pay | Admitting: *Deleted

## 2020-01-19 VITALS — Ht 72.0 in | Wt 230.0 lb

## 2020-01-19 DIAGNOSIS — Z1211 Encounter for screening for malignant neoplasm of colon: Secondary | ICD-10-CM

## 2020-01-19 MED ORDER — SUTAB 1479-225-188 MG PO TABS: 1.0000 | ORAL_TABLET | Freq: Once | ORAL | 0 refills | Status: AC

## 2020-01-19 NOTE — Progress Notes (Signed)

## 2020-01-27 ENCOUNTER — Other Ambulatory Visit: Payer: Self-pay | Admitting: Family Medicine

## 2020-01-28 ENCOUNTER — Other Ambulatory Visit: Payer: Self-pay | Admitting: *Deleted

## 2020-01-28 MED ORDER — LOSARTAN POTASSIUM 100 MG PO TABS: 100.0000 mg | ORAL_TABLET | Freq: Every day | ORAL | 0 refills | Status: DC

## 2020-01-28 MED ORDER — HYDROCHLOROTHIAZIDE 25 MG PO TABS: 25.0000 mg | ORAL_TABLET | Freq: Every day | ORAL | 0 refills | Status: DC

## 2020-01-28 NOTE — Telephone Encounter (Signed)
Last OV 01/05/20 Last refill 01/05/20 #30/3 Next OV 02/25/19  REQUEST FOR 90 DAY SUPPLY

## 2020-02-03 ENCOUNTER — Encounter: Payer: Self-pay | Admitting: Gastroenterology

## 2020-02-08 ENCOUNTER — Other Ambulatory Visit: Payer: Self-pay

## 2020-02-08 ENCOUNTER — Ambulatory Visit (AMBULATORY_SURGERY_CENTER): Payer: BC Managed Care – PPO | Admitting: Gastroenterology

## 2020-02-08 ENCOUNTER — Encounter: Payer: Self-pay | Admitting: Gastroenterology

## 2020-02-08 VITALS — BP 129/79 | HR 73 | Temp 97.5°F | Resp 15 | Ht 72.0 in | Wt 230.0 lb

## 2020-02-08 DIAGNOSIS — Z1211 Encounter for screening for malignant neoplasm of colon: Secondary | ICD-10-CM | POA: Diagnosis not present

## 2020-02-08 DIAGNOSIS — D125 Benign neoplasm of sigmoid colon: Secondary | ICD-10-CM

## 2020-02-08 DIAGNOSIS — D123 Benign neoplasm of transverse colon: Secondary | ICD-10-CM | POA: Diagnosis not present

## 2020-02-08 MED ORDER — SODIUM CHLORIDE 0.9 % IV SOLN: 500.0000 mL | Freq: Once | INTRAVENOUS | Status: DC

## 2020-02-08 NOTE — Patient Instructions (Signed)
HANDOUTS PROVIDED ON: POLYPS, DIVERTICULOSIS, & HEMORRHOIDS  The polyps removed today have been sent for pathology.  The results can take 1-3 weeks to receive.  When your next colonoscopy should occur will be based on the pathology results.    You may resume your previous diet and medication schedule.  Thank you for allowing us to care for you today!!!   YOU HAD AN ENDOSCOPIC PROCEDURE TODAY AT THE [facility]:   Refer to the procedure report that was given to you for any specific questions about what was found during the examination.  If the procedure report does not answer your questions, please call your gastroenterologist to clarify.  If you requested that your care partner not be given the details of your procedure findings, then the procedure report has been included in a sealed envelope for you to review at your convenience later.  YOU SHOULD EXPECT: Some feelings of bloating in the abdomen. Passage of more gas than usual.  Walking can help get rid of the air that was put into your GI tract during the procedure and reduce the bloating. If you had a lower endoscopy (such as a colonoscopy or flexible sigmoidoscopy) you may notice spotting of blood in your stool or on the toilet paper. If you underwent a bowel prep for your procedure, you may not have a normal bowel movement for a few days.  Please Note:  You might notice some irritation and congestion in your nose or some drainage.  This is from the oxygen used during your procedure.  There is no need for concern and it should clear up in a day or so.  SYMPTOMS TO REPORT IMMEDIATELY:   Following lower endoscopy (colonoscopy or flexible sigmoidoscopy):  Excessive amounts of blood in the stool  Significant tenderness or worsening of abdominal pains  Swelling of the abdomen that is new, acute  Fever of 100F or higher  For urgent or emergent issues, a gastroenterologist can be reached at any hour by calling [phone]. Do  not use MyChart messaging for urgent concerns.    DIET:  We do recommend a small meal at first, but then you may proceed to your regular diet.  Drink plenty of fluids but you should avoid alcoholic beverages for 24 hours.  ACTIVITY:  You should plan to take it easy for the rest of today and you should NOT DRIVE or use heavy machinery until tomorrow (because of the sedation medicines used during the test).    FOLLOW UP: Our staff will call the number listed on your records Wednesday morning between 7:15 am and 8:15 am to check on you and address any questions or concerns that you may have regarding the information given to you following your procedure. If we do not reach you, we will leave a message.  We will attempt to reach you two times.  During this call, we will ask if you have developed any symptoms of COVID 19. If you develop any symptoms (ie: fever, flu-like symptoms, shortness of breath, cough etc.) before then, please call [phone].  If you test positive for Covid 19 in the 2 weeks post procedure, please call and report this information to us.    If any biopsies were taken you will be contacted by phone or by letter within the next 1-3 weeks.  Please call us at [phone] if you have not heard about the biopsies in 3 weeks.    SIGNATURES/CONFIDENTIALITY: You and/or your care partner have signed   paperwork which will be entered into your electronic medical record.  These signatures attest to the fact that that the information above on your After Visit Summary has been reviewed and is understood.  Full responsibility of the confidentiality of this discharge information lies with you and/or your care-partner.

## 2020-02-08 NOTE — Progress Notes (Signed)
Report to PACU, RN, vss, BBS= Clear.  

## 2020-02-08 NOTE — Progress Notes (Signed)
Pt's states no medical or surgical changes since previsit or office visit.  CW - vitals 

## 2020-02-08 NOTE — Op Note (Signed)
[facility] Patient Name: Henry Young Procedure Date: 02/08/2020 10:53 AM MRN: [id number] Endoscopist: Napoleon Form , MD Age: 50 Referring MD:  Date of Birth: September 22, 1970 Gender: Male Account #: [id number] Procedure:                Colonoscopy Indications:              Screening for colorectal malignant neoplasm Medicines:                Monitored Anesthesia Care Procedure:                Pre-Anesthesia Assessment:                           - Prior to the procedure, a History and Physical                            was performed, and patient medications and                            allergies were reviewed. The patient's tolerance of                            previous anesthesia was also reviewed. The risks                            and benefits of the procedure and the sedation                            options and risks were discussed with the patient.                            All questions were answered, and informed consent                            was obtained. Prior Anticoagulants: The patient has                            taken no previous anticoagulant or antiplatelet                            agents. ASA Grade Assessment: II - A patient with                            mild systemic disease. After reviewing the risks                            and benefits, the patient was deemed in                            satisfactory condition to undergo the procedure.                           After obtaining informed consent, the colonoscope  was passed under direct vision. Throughout the                            procedure, the patient's blood pressure, pulse, and                            oxygen saturations were monitored continuously. The                            Olympus PCF-[id number]) Colonoscope was                            introduced through the anus and advanced to the the                            cecum,  identified by appendiceal orifice and                            ileocecal valve. The colonoscopy was performed                            without difficulty. The patient tolerated the                            procedure well. The quality of the bowel                            preparation was excellent. The ileocecal valve,                            appendiceal orifice, and rectum were photographed. Scope In: 11:04:54 AM Scope Out: 11:14:23 AM Scope Withdrawal Time: 0 hours 7 minutes 22 seconds  Total Procedure Duration: 0 hours 9 minutes 29 seconds  Findings:                 The perianal and digital rectal examinations were                            normal.                           Two sessile polyps were found in the sigmoid colon                            and transverse colon. The polyps were 4 to 7 mm in                            size. These polyps were removed with a cold snare.                            Resection and retrieval were complete.                           Scattered small-mouthed diverticula were found in  the sigmoid colon. Peri-diverticular erythema was                            seen.                           Non-bleeding external and internal hemorrhoids were                            found during retroflexion. The hemorrhoids were                            small. Complications:            No immediate complications. Estimated Blood Loss:     Estimated blood loss was minimal. Impression:               - Two 4 to 7 mm polyps in the sigmoid colon and in                            the transverse colon, removed with a cold snare.                            Resected and retrieved.                           - Mild diverticulosis in the sigmoid colon.                            Peri-diverticular erythema was seen.                           - Non-bleeding external and internal hemorrhoids. Recommendation:           - Patient has a  contact number available for                            emergencies. The signs and symptoms of potential                            delayed complications were discussed with the                            patient. Return to normal activities tomorrow.                            Written discharge instructions were provided to the                            patient.                           - Resume previous diet.                           - Continue present medications.                           -  Await pathology results.                           - Repeat colonoscopy in 5-10 years for surveillance                            based on pathology results. [name], MD 02/08/2020 11:19:16 AM This report has been signed electronically.

## 2020-02-08 NOTE — Progress Notes (Signed)
Called to room to assist during endoscopic procedure.  Patient ID and intended procedure confirmed with present staff. Received instructions for my participation in the procedure from the performing physician.  

## 2020-02-10 ENCOUNTER — Telehealth: Payer: Self-pay | Admitting: *Deleted

## 2020-02-10 NOTE — Telephone Encounter (Signed)
  Follow up Call-  Call back number 02/08/2020  Post procedure Call Back phone  # [phone]  Permission to leave phone message Yes  Some recent data might be hidden     Patient questions:  Do you have a fever, pain , or abdominal swelling? No. Pain Score  0 *  Have you tolerated food without any problems? Yes.    Have you been able to return to your normal activities? Yes.    Do you have any questions about your discharge instructions: Diet   No. Medications  No. Follow up visit  No.  Do you have questions or concerns about your Care? No.  Actions: * If pain score is 4 or above: No action needed, pain <4.  1. Have you developed a fever since your procedure? no  2.   Have you had an respiratory symptoms (SOB or cough) since your procedure? no  3.   Have you tested positive for COVID 19 since your procedure no  4.   Have you had any family members/close contacts diagnosed with the COVID 19 since your procedure?  no   If yes to any of these questions please route to Joylene John, RN and Joella Prince, RN

## 2020-02-14 ENCOUNTER — Other Ambulatory Visit: Payer: Self-pay | Admitting: Family Medicine

## 2020-02-22 ENCOUNTER — Encounter: Payer: BC Managed Care – PPO | Admitting: Family Medicine

## 2020-02-23 ENCOUNTER — Encounter: Payer: Self-pay | Admitting: Gastroenterology

## 2020-02-25 ENCOUNTER — Encounter: Payer: BC Managed Care – PPO | Admitting: Family Medicine

## 2020-03-11 ENCOUNTER — Telehealth: Payer: Self-pay

## 2020-03-11 NOTE — Telephone Encounter (Signed)
Pt would like all his medications sent to [facility] [address]

## 2020-03-11 NOTE — Telephone Encounter (Signed)
Pharmacy verified on patient chart

## 2020-03-14 NOTE — Telephone Encounter (Signed)
Patient states due to his new insurance he [facility] no longer [facility]  Patient is asking for all meds to be sent to [facility] as soon as possible.    Patient states he has BCBS but does not have a current card or info to give to [place] at this time.  States he is trying to get this info from [facility].

## 2020-03-14 NOTE — Telephone Encounter (Signed)
Pharmacy changed on patient chart

## 2020-03-14 NOTE — Telephone Encounter (Signed)
[facility] preferred [facility]

## 2020-03-19 ENCOUNTER — Other Ambulatory Visit: Payer: Self-pay | Admitting: Family Medicine

## 2020-03-28 ENCOUNTER — Other Ambulatory Visit: Payer: Self-pay | Admitting: *Deleted

## 2020-03-28 MED ORDER — TRAZODONE HCL 50 MG PO TABS: ORAL_TABLET | ORAL | 0 refills | Status: AC

## 2020-03-28 MED ORDER — EMPAGLIFLOZIN 10 MG PO TABS: ORAL_TABLET | ORAL | 3 refills | Status: AC

## 2020-03-28 NOTE — Telephone Encounter (Signed)
Pharmacy requesting new Rx, patient transferring to [facility]

## 2020-03-30 ENCOUNTER — Other Ambulatory Visit: Payer: Self-pay | Admitting: Orthopedic Surgery

## 2020-04-01 ENCOUNTER — Telehealth: Payer: Self-pay

## 2020-04-01 NOTE — Telephone Encounter (Signed)
Patient would like to see if we can send in a generic verison of empagliflozin (JARDIANCE) 10 MG TABS tablet because his new insurance is going to cost him 300 + dollars [id number] [place], [place]

## 2020-04-04 NOTE — Telephone Encounter (Signed)
Refills was done

## 2020-04-04 NOTE — Telephone Encounter (Signed)
Pt is wanting a generic/cheaper version of the Jardiance. He is not wanting a refill of the Jardiance. He cannot afford it.

## 2020-04-06 NOTE — Telephone Encounter (Signed)
See below

## 2020-04-07 NOTE — Telephone Encounter (Signed)
Call patient advise to contact insurance company for prices and recommendation

## 2020-04-19 ENCOUNTER — Encounter (HOSPITAL_COMMUNITY): Payer: Self-pay

## 2020-04-19 ENCOUNTER — Other Ambulatory Visit: Payer: Self-pay

## 2020-04-19 ENCOUNTER — Encounter (HOSPITAL_COMMUNITY)
Admission: RE | Admit: 2020-04-19 | Discharge: 2020-04-19 | Disposition: A | Discharge status: Home / Self Care | Payer: Worker's Compensation | Source: Ambulatory Visit | Attending: Orthopedic Surgery | Admitting: Orthopedic Surgery

## 2020-04-19 DIAGNOSIS — Z01818 Encounter for other preprocedural examination: Secondary | ICD-10-CM | POA: Insufficient documentation

## 2020-04-19 DIAGNOSIS — Z20822 Contact with and (suspected) exposure to covid-19: Secondary | ICD-10-CM | POA: Insufficient documentation

## 2020-04-19 HISTORY — DX: Prediabetes: R73.03

## 2020-04-19 LAB — URINALYSIS, ROUTINE W REFLEX MICROSCOPIC
Bacteria, UA: NONE SEEN
Bilirubin Urine: NEGATIVE
Glucose, UA: >=500 mg/dL — AB
Hgb urine dipstick: NEGATIVE
Ketones, ur: NEGATIVE mg/dL
Leukocytes,Ua: NEGATIVE
Nitrite: NEGATIVE
Protein, ur: NEGATIVE mg/dL
Specific Gravity, Urine: 1.014 (ref 1.005–1.030)
pH: 7 (ref 5.0–8.0)

## 2020-04-19 LAB — CBC WITH DIFFERENTIAL/PLATELET
Abs Immature Granulocytes: 0.02 10*3/uL (ref 0.00–0.07)
Basophils Absolute: 0 10*3/uL (ref 0.0–0.1)
Basophils Relative: 1 %
Eosinophils Absolute: 0 10*3/uL (ref 0.0–0.5)
Eosinophils Relative: 1 %
HCT: 48.3 % (ref 39.0–52.0)
Hemoglobin: 15.9 g/dL (ref 13.0–17.0)
Immature Granulocytes: 0 %
Lymphocytes Relative: 26 %
Lymphs Abs: 1.7 10*3/uL (ref 0.7–4.0)
MCH: 28.3 pg (ref 26.0–34.0)
MCHC: 32.9 g/dL (ref 30.0–36.0)
MCV: 85.9 fL (ref 80.0–100.0)
Monocytes Absolute: 0.6 10*3/uL (ref 0.1–1.0)
Monocytes Relative: 9 %
Neutro Abs: 4.1 10*3/uL (ref 1.7–7.7)
Neutrophils Relative %: 63 %
Platelets: 271 10*3/uL (ref 150–400)
RBC: 5.62 MIL/uL (ref 4.22–5.81)
RDW: 14.6 % (ref 11.5–15.5)
WBC: 6.4 10*3/uL (ref 4.0–10.5)
nRBC: 0 % (ref 0.0–0.2)

## 2020-04-19 LAB — COMPREHENSIVE METABOLIC PANEL
ALT: 46 U/L — ABNORMAL HIGH (ref 0–44)
AST: 37 U/L (ref 15–41)
Albumin: 3.8 g/dL (ref 3.5–5.0)
Alkaline Phosphatase: 68 U/L (ref 38–126)
Anion gap: 7 (ref 5–15)
BUN: 11 mg/dL (ref 6–20)
CO2: 27 mmol/L (ref 22–32)
Calcium: 9.2 mg/dL (ref 8.9–10.3)
Chloride: 104 mmol/L (ref 98–111)
Creatinine, Ser: 1.18 mg/dL (ref 0.61–1.24)
GFR, Estimated: >60 mL/min (ref >60)
Glucose, Bld: 121 mg/dL — ABNORMAL HIGH (ref 70–99)
Potassium: 3.4 mmol/L — ABNORMAL LOW (ref 3.5–5.1)
Sodium: 138 mmol/L (ref 135–145)
Total Bilirubin: 0.8 mg/dL (ref 0.3–1.2)
Total Protein: 7.3 g/dL (ref 6.5–8.1)

## 2020-04-19 LAB — APTT: aPTT: 26 seconds (ref 24–36)

## 2020-04-19 LAB — PROTIME-INR
INR: 1 (ref 0.8–1.2)
Prothrombin Time: 13.1 seconds (ref 11.4–15.2)

## 2020-04-19 LAB — TYPE AND SCREEN
ABO/RH(D): A POS
Antibody Screen: NEGATIVE

## 2020-04-19 LAB — SURGICAL PCR SCREEN
MRSA, PCR: NEGATIVE
Staphylococcus aureus: NEGATIVE

## 2020-04-19 LAB — GLUCOSE, CAPILLARY: Glucose-Capillary: 117 mg/dL — ABNORMAL HIGH (ref 70–99)

## 2020-04-19 LAB — SARS CORONAVIRUS 2 (TAT 6-24 HRS): SARS Coronavirus 2: NEGATIVE

## 2020-04-19 NOTE — Progress Notes (Signed)
PCP - Erby Pian Cardiologist - denies  PPM/ICD - denies   Chest x-ray - n/a EKG - 04/19/20 Stress Test - denies ECHO - denies Cardiac Cath - denies  Sleep Study - denies  . Patient instructed to hold all Aspirin, NSAID's, herbal medications, fish oil and vitamins 7 days prior to surgery.  ERAS Protcol -yes PRE-SURGERY Ensure or G2- water given  COVID TEST- 04/19/20   Anesthesia review: no  Patient denies shortness of breath, fever, cough and chest pain at PAT appointment   All instructions explained to the patient, with a verbal understanding of the material. Patient agrees to go over the instructions while at home for a better understanding. Patient also instructed to self quarantine after being tested for COVID-19. The opportunity to ask questions was provided.

## 2020-04-19 NOTE — Progress Notes (Signed)
Surgical Instructions    Your procedure is scheduled on 04/21/20.  Report to [facility] Main Entrance "A" at 05:30 A.M., then check in with the Admitting office.  Call this number if you have problems the morning of surgery:  [phone]   If you have any questions prior to your surgery date call [phone]: Open Monday-Friday 8am-4pm    Remember:  Do not eat after midnight the night before your surgery  You may drink clear liquids until 04:30am the morning of your surgery.   Clear liquids allowed are: Water, Non-Citrus Juices (without pulp), Carbonated Beverages, Clear Tea, Black Coffee Only, and Gatorade  Patient Instructions  . The night before surgery:  o No food after midnight. ONLY clear liquids after midnight  . The day of surgery (if you have diabetes): o Drink ONE (1) 10 oz water bottle given to you in your pre admission testing appointment by 04:30am the morning of surgery. Drink in one sitting. Do not sip.  o This drink was given to you during your hospital  pre-op appointment visit.  o Nothing else to drink after completing the  10 oz bottle of water.          If you have questions, please contact your surgeon's office.     Take these medicines the morning of surgery with A SIP OF WATER  atorvastatin (LIPITOR)   As of today, STOP taking any Aspirin (unless otherwise instructed by your surgeon) Aleve, Naproxen, Ibuprofen, Motrin, Advil, Goody's, BC's, all herbal medications, fish oil, and all vitamins.    WHAT DO I DO ABOUT MY DIABETES MEDICATION?   Marland Kitchen Do not take oral diabetes medicines (pills) the morning of surgery.  . THE DAY BEFORE SURGERY, do not take empagliflozin (JARDIANCE).    . THE MORNING OF SURGERY, do not take empagliflozin (JARDIANCE).  . The day of surgery, do not take other diabetes injectables, including Byetta (exenatide), Bydureon (exenatide ER), Victoza (liraglutide), or Trulicity (dulaglutide).  . If your CBG is greater than 220  mg/dL, you may take  of your sliding scale (correction) dose of insulin.   HOW TO MANAGE YOUR DIABETES BEFORE AND AFTER SURGERY  Why is it important to control my blood sugar before and after surgery? . Improving blood sugar levels before and after surgery helps healing and can limit problems. . A way of improving blood sugar control is eating a healthy diet by: o  Eating less sugar and carbohydrates o  Increasing activity/exercise o  Talking with your doctor about reaching your blood sugar goals . High blood sugars (greater than 180 mg/dL) can raise your risk of infections and slow your recovery, so you will need to focus on controlling your diabetes during the weeks before surgery. . Make sure that the doctor who takes care of your diabetes knows about your planned surgery including the date and location.  How do I manage my blood sugar before surgery? . Check your blood sugar at least 4 times a day, starting 2 days before surgery, to make sure that the level is not too high or low. . Check your blood sugar the morning of your surgery when you wake up and every 2 hours until you get to the Short Stay unit. o If your blood sugar is less than 70 mg/dL, you will need to treat for low blood sugar: - Do not take insulin. - Treat a low blood sugar (less than 70 mg/dL) with  cup of clear juice (cranberry or apple), 4  glucose tablets, OR glucose gel. - Recheck blood sugar in 15 minutes after treatment (to make sure it is greater than 70 mg/dL). If your blood sugar is not greater than 70 mg/dL on recheck, call [phone] for further instructions. . Report your blood sugar to the short stay nurse when you get to Short Stay.  . If you are admitted to the hospital after surgery: o Your blood sugar will be checked by the staff and you will probably be given insulin after surgery (instead of oral diabetes medicines) to make sure you have good blood sugar levels. o The goal for blood sugar control  after surgery is 80-180 mg/dL.                      Do not wear jewelry, make up, or nail polish            Do not wear lotions, powders, perfumes/colognes, or deodorant.            Do not shave 48 hours prior to surgery.  Men may shave face and neck.            Do not bring valuables to the hospital.            [facility] is not responsible for any belongings or valuables.  Do NOT Smoke (Tobacco/Vaping) or drink Alcohol 24 hours prior to your procedure If you use a CPAP at night, you may bring all equipment for your overnight stay.   Contacts, glasses, dentures or bridgework may not be worn into surgery, please bring cases for these belongings   For patients admitted to the hospital, discharge time will be determined by your treatment team.   Patients discharged the day of surgery will not be allowed to drive home, and someone needs to stay with them for 24 hours.    Special instructions:   [place]- Preparing For Surgery  Before surgery, you can play an important role. Because skin is not sterile, your skin needs to be as free of germs as possible. You can reduce the number of germs on your skin by washing with CHG (chlorahexidine gluconate) Soap before surgery.  CHG is an antiseptic cleaner which kills germs and bonds with the skin to continue killing germs even after washing.    Oral Hygiene is also important to reduce your risk of infection.  Remember - BRUSH YOUR TEETH THE MORNING OF SURGERY WITH YOUR REGULAR TOOTHPASTE  Please do not use if you have an allergy to CHG or antibacterial soaps. If your skin becomes reddened/irritated stop using the CHG.  Do not shave (including legs and underarms) for at least 48 hours prior to first CHG shower. It is OK to shave your face.  Please follow these instructions carefully.   1. Shower the NIGHT BEFORE SURGERY and the MORNING OF SURGERY  2. If you chose to wash your hair, wash your hair first as usual with your normal  shampoo.  3. After you shampoo, rinse your hair and body thoroughly to remove the shampoo.  4. Wash Face and genitals (private parts) with your normal soap.   5.  Shower the NIGHT BEFORE SURGERY and the MORNING OF SURGERY with CHG Soap.   6. Use CHG Soap as you would any other liquid soap. You can apply CHG directly to the skin and wash gently with a scrungie or a clean washcloth.   7. Apply the CHG Soap to your body ONLY FROM THE NECK  DOWN.  Do not use on open wounds or open sores. Avoid contact with your eyes, ears, mouth and genitals (private parts). Wash Face and genitals (private parts)  with your normal soap.   8. Wash thoroughly, paying special attention to the area where your surgery will be performed.  9. Thoroughly rinse your body with warm water from the neck down.  10. DO NOT shower/wash with your normal soap after using and rinsing off the CHG Soap.  11. Pat yourself dry with a CLEAN TOWEL.  12. Wear CLEAN PAJAMAS to bed the night before surgery  13. Place CLEAN SHEETS on your bed the night before your surgery  14. DO NOT SLEEP WITH PETS.   Day of Surgery: Take a shower with CHG soap.  Wear Clean/Comfortable clothing the morning of surgery Do not apply any deodorants/lotions.   Remember to brush your teeth WITH YOUR REGULAR TOOTHPASTE.   Please read over the following fact sheets that you were given.

## 2020-04-20 NOTE — Anesthesia Preprocedure Evaluation (Addendum)
Anesthesia Evaluation  Patient identified by MRN, date of birth, ID band Patient awake    Reviewed: Allergy & Precautions, NPO status , Patient's Chart, lab work & pertinent test results  History of Anesthesia Complications Negative for: history of anesthetic complications  Airway Mallampati: II  TM Distance: >3 FB Neck ROM: Full    Dental  (+) Teeth Intact   Pulmonary neg pulmonary ROS,    Pulmonary exam normal        Cardiovascular hypertension, Pt. on medications Normal cardiovascular exam     Neuro/Psych negative neurological ROS     GI/Hepatic negative GI ROS, Neg liver ROS,   Endo/Other  diabetes, Oral Hypoglycemic Agents  Renal/GU negative Renal ROS  negative genitourinary   Musculoskeletal negative musculoskeletal ROS (+)   Abdominal   Peds  Hematology negative hematology ROS (+)   Anesthesia Other Findings   Reproductive/Obstetrics                            Anesthesia Physical Anesthesia Plan  ASA: II  Anesthesia Plan: General   Post-op Pain Management:    Induction: Intravenous  PONV Risk Score and Plan: 2 and Ondansetron, Dexamethasone, Treatment may vary due to age or medical condition and Midazolam  Airway Management Planned: Oral ETT  Additional Equipment: None  Intra-op Plan:   Post-operative Plan: Extubation in OR  Informed Consent: I have reviewed the patients History and Physical, chart, labs and discussed the procedure including the risks, benefits and alternatives for the proposed anesthesia with the patient or authorized representative who has indicated his/her understanding and acceptance.     Dental advisory given  Plan Discussed with:   Anesthesia Plan Comments:        Anesthesia Quick Evaluation

## 2020-04-21 ENCOUNTER — Inpatient Hospital Stay (HOSPITAL_COMMUNITY): Payer: Worker's Compensation | Admitting: Anesthesiology

## 2020-04-21 ENCOUNTER — Inpatient Hospital Stay (HOSPITAL_COMMUNITY)
Admission: RE | Admit: 2020-04-21 | Discharge: 2020-04-22 | DRG: 455 | Disposition: A | Discharge status: Home / Self Care | Payer: Worker's Compensation | Attending: Orthopedic Surgery | Admitting: Orthopedic Surgery

## 2020-04-21 ENCOUNTER — Encounter (HOSPITAL_COMMUNITY): Payer: Self-pay | Admitting: Orthopedic Surgery

## 2020-04-21 ENCOUNTER — Inpatient Hospital Stay (HOSPITAL_COMMUNITY)
Admission: RE | Disposition: A | Discharge status: Home / Self Care | Payer: Self-pay | Source: Home / Self Care | Attending: Orthopedic Surgery

## 2020-04-21 ENCOUNTER — Inpatient Hospital Stay (HOSPITAL_COMMUNITY): Payer: Worker's Compensation

## 2020-04-21 ENCOUNTER — Other Ambulatory Visit: Payer: Self-pay

## 2020-04-21 DIAGNOSIS — I10 Essential (primary) hypertension: Secondary | ICD-10-CM | POA: Diagnosis present

## 2020-04-21 DIAGNOSIS — Z7984 Long term (current) use of oral hypoglycemic drugs: Secondary | ICD-10-CM

## 2020-04-21 DIAGNOSIS — Z419 Encounter for procedure for purposes other than remedying health state, unspecified: Secondary | ICD-10-CM

## 2020-04-21 DIAGNOSIS — M5410 Radiculopathy, site unspecified: Secondary | ICD-10-CM | POA: Diagnosis present

## 2020-04-21 DIAGNOSIS — M5416 Radiculopathy, lumbar region: Secondary | ICD-10-CM | POA: Diagnosis present

## 2020-04-21 DIAGNOSIS — E785 Hyperlipidemia, unspecified: Secondary | ICD-10-CM | POA: Diagnosis present

## 2020-04-21 DIAGNOSIS — E119 Type 2 diabetes mellitus without complications: Secondary | ICD-10-CM | POA: Diagnosis present

## 2020-04-21 DIAGNOSIS — Z20822 Contact with and (suspected) exposure to covid-19: Secondary | ICD-10-CM | POA: Diagnosis present

## 2020-04-21 DIAGNOSIS — M48061 Spinal stenosis, lumbar region without neurogenic claudication: Secondary | ICD-10-CM | POA: Diagnosis present

## 2020-04-21 DIAGNOSIS — Z79899 Other long term (current) drug therapy: Secondary | ICD-10-CM

## 2020-04-21 DIAGNOSIS — Z8249 Family history of ischemic heart disease and other diseases of the circulatory system: Secondary | ICD-10-CM | POA: Diagnosis not present

## 2020-04-21 HISTORY — PX: TRANSFORAMINAL LUMBAR INTERBODY FUSION (TLIF) WITH PEDICLE SCREW FIXATION 1 LEVEL: SHX6141

## 2020-04-21 LAB — ABO/RH: ABO/RH(D): A POS

## 2020-04-21 LAB — GLUCOSE, CAPILLARY
Glucose-Capillary: 142 mg/dL — ABNORMAL HIGH (ref 70–99)
Glucose-Capillary: 130 mg/dL — ABNORMAL HIGH (ref 70–99)
Glucose-Capillary: 121 mg/dL — ABNORMAL HIGH (ref 70–99)
Glucose-Capillary: 148 mg/dL — ABNORMAL HIGH (ref 70–99)

## 2020-04-21 SURGERY — TRANSFORAMINAL LUMBAR INTERBODY FUSION (TLIF) WITH PEDICLE SCREW FIXATION 1 LEVEL
Anesthesia: General | Laterality: Left

## 2020-04-21 MED ORDER — HYDROMORPHONE HCL 1 MG/ML IJ SOLN
INTRAMUSCULAR | Status: DC | PRN
  Administered 2020-04-21 (×2): .5 mg via INTRAVENOUS

## 2020-04-21 MED ORDER — FENTANYL CITRATE (PF) 250 MCG/5ML IJ SOLN
INTRAMUSCULAR | Status: AC
  Filled 2020-04-21: qty 5

## 2020-04-21 MED ORDER — PROPOFOL 10 MG/ML IV BOLUS
INTRAVENOUS | Status: DC | PRN
  Administered 2020-04-21: 100 mg via INTRAVENOUS
  Administered 2020-04-21: 200 mg via INTRAVENOUS

## 2020-04-21 MED ORDER — CHLORHEXIDINE GLUCONATE 0.12 % MT SOLN
15.0000 mL | Freq: Once | OROMUCOSAL | Status: AC
  Administered 2020-04-21: 15 mL via OROMUCOSAL
  Filled 2020-04-21: qty 15

## 2020-04-21 MED ORDER — EPHEDRINE SULFATE-NACL 50-0.9 MG/10ML-% IV SOSY
PREFILLED_SYRINGE | INTRAVENOUS | Status: DC | PRN
  Administered 2020-04-21 (×2): 10 mg via INTRAVENOUS
  Administered 2020-04-21 (×2): 5 mg via INTRAVENOUS

## 2020-04-21 MED ORDER — EPHEDRINE 5 MG/ML INJ
INTRAVENOUS | Status: AC
  Filled 2020-04-21: qty 10

## 2020-04-21 MED ORDER — PHENOL 1.4 % MT LIQD: 1.0000 | OROMUCOSAL | Status: DC | PRN

## 2020-04-21 MED ORDER — ROCURONIUM BROMIDE 100 MG/10ML IV SOLN
INTRAVENOUS | Status: DC | PRN
  Administered 2020-04-21: 100 mg via INTRAVENOUS
  Administered 2020-04-21: 30 mg via INTRAVENOUS

## 2020-04-21 MED ORDER — HYDROMORPHONE HCL 1 MG/ML IJ SOLN
INTRAMUSCULAR | Status: AC
  Filled 2020-04-21: qty 0.5

## 2020-04-21 MED ORDER — HYDROCODONE-ACETAMINOPHEN 7.5-325 MG PO TABS
1.0000 | ORAL_TABLET | ORAL | Status: DC | PRN
Start: 2020-04-21 — End: 2020-04-22
  Administered 2020-04-21: 1 via ORAL

## 2020-04-21 MED ORDER — SODIUM CHLORIDE 0.9% FLUSH
3.0000 mL | Freq: Two times a day (BID) | INTRAVENOUS | Status: DC
  Administered 2020-04-21 (×2): 3 mL via INTRAVENOUS

## 2020-04-21 MED ORDER — LACTATED RINGERS IV SOLN: INTRAVENOUS | Status: DC

## 2020-04-21 MED ORDER — ONDANSETRON HCL 4 MG/2ML IJ SOLN: 4.0000 mg | Freq: Four times a day (QID) | INTRAMUSCULAR | Status: DC | PRN

## 2020-04-21 MED ORDER — ONDANSETRON HCL 4 MG/2ML IJ SOLN
INTRAMUSCULAR | Status: AC
  Filled 2020-04-21: qty 2

## 2020-04-21 MED ORDER — HYDROCODONE-ACETAMINOPHEN 7.5-325 MG PO TABS
ORAL_TABLET | ORAL | Status: AC
  Filled 2020-04-21: qty 1

## 2020-04-21 MED ORDER — MORPHINE SULFATE (PF) 2 MG/ML IV SOLN
1.0000 mg | INTRAVENOUS | Status: DC | PRN
  Administered 2020-04-21 – 2020-04-22 (×2): 2 mg via INTRAVENOUS
  Filled 2020-04-21 (×3): qty 1

## 2020-04-21 MED ORDER — SENNOSIDES-DOCUSATE SODIUM 8.6-50 MG PO TABS: 1.0000 | ORAL_TABLET | Freq: Every evening | ORAL | Status: DC | PRN

## 2020-04-21 MED ORDER — HYDROMORPHONE HCL 1 MG/ML IJ SOLN
INTRAMUSCULAR | Status: AC
  Filled 2020-04-21: qty 1

## 2020-04-21 MED ORDER — ONDANSETRON HCL 4 MG/2ML IJ SOLN
INTRAMUSCULAR | Status: DC | PRN
  Administered 2020-04-21 (×2): 4 mg via INTRAVENOUS

## 2020-04-21 MED ORDER — HYDROCHLOROTHIAZIDE 25 MG PO TABS
25.0000 mg | ORAL_TABLET | Freq: Every day | ORAL | Status: DC
  Administered 2020-04-22: 25 mg via ORAL
  Filled 2020-04-21: qty 1

## 2020-04-21 MED ORDER — SODIUM CHLORIDE 0.9% FLUSH: 3.0000 mL | INTRAVENOUS | Status: DC | PRN

## 2020-04-21 MED ORDER — ROCURONIUM BROMIDE 10 MG/ML (PF) SYRINGE
PREFILLED_SYRINGE | INTRAVENOUS | Status: AC
  Filled 2020-04-21: qty 10

## 2020-04-21 MED ORDER — ALUM & MAG HYDROXIDE-SIMETH 200-200-20 MG/5ML PO SUSP: 30.0000 mL | Freq: Four times a day (QID) | ORAL | Status: DC | PRN

## 2020-04-21 MED ORDER — METHOCARBAMOL 500 MG PO TABS
500.0000 mg | ORAL_TABLET | Freq: Four times a day (QID) | ORAL | Status: DC | PRN
  Administered 2020-04-21 – 2020-04-22 (×4): 500 mg via ORAL
  Filled 2020-04-21 (×3): qty 1

## 2020-04-21 MED ORDER — ALBUMIN HUMAN 5 % IV SOLN: INTRAVENOUS | Status: DC | PRN

## 2020-04-21 MED ORDER — PHENYLEPHRINE 40 MCG/ML (10ML) SYRINGE FOR IV PUSH (FOR BLOOD PRESSURE SUPPORT)
PREFILLED_SYRINGE | INTRAVENOUS | Status: AC
  Filled 2020-04-21: qty 10

## 2020-04-21 MED ORDER — ZOLPIDEM TARTRATE 5 MG PO TABS: 5.0000 mg | ORAL_TABLET | Freq: Every evening | ORAL | Status: DC | PRN

## 2020-04-21 MED ORDER — PROPOFOL 10 MG/ML IV BOLUS
INTRAVENOUS | Status: AC
  Filled 2020-04-21: qty 40

## 2020-04-21 MED ORDER — HYDROMORPHONE HCL 1 MG/ML IJ SOLN
0.2500 mg | INTRAMUSCULAR | Status: DC | PRN
  Administered 2020-04-21 (×2): 0.5 mg via INTRAVENOUS

## 2020-04-21 MED ORDER — BUPIVACAINE LIPOSOME 1.3 % IJ SUSP
INTRAMUSCULAR | Status: AC
  Filled 2020-04-21: qty 20

## 2020-04-21 MED ORDER — METHYLENE BLUE 0.5 % INJ SOLN
INTRAVENOUS | Status: AC
  Filled 2020-04-21: qty 10

## 2020-04-21 MED ORDER — AMISULPRIDE (ANTIEMETIC) 5 MG/2ML IV SOLN: 10.0000 mg | Freq: Once | INTRAVENOUS | Status: DC | PRN

## 2020-04-21 MED ORDER — THROMBIN 20000 UNITS EX SOLR
CUTANEOUS | Status: DC | PRN
  Administered 2020-04-21: 20000 [IU] via TOPICAL

## 2020-04-21 MED ORDER — LIDOCAINE 2% (20 MG/ML) 5 ML SYRINGE
INTRAMUSCULAR | Status: AC
  Filled 2020-04-21: qty 5

## 2020-04-21 MED ORDER — SODIUM CHLORIDE 0.9 % IV SOLN: 250.0000 mL | INTRAVENOUS | Status: DC

## 2020-04-21 MED ORDER — GLYCOPYRROLATE PF 0.2 MG/ML IJ SOSY
PREFILLED_SYRINGE | INTRAMUSCULAR | Status: DC | PRN
  Administered 2020-04-21: .1 mg via INTRAVENOUS

## 2020-04-21 MED ORDER — ACETAMINOPHEN 650 MG RE SUPP: 650.0000 mg | RECTAL | Status: DC | PRN

## 2020-04-21 MED ORDER — METHOCARBAMOL 1000 MG/10ML IJ SOLN
500.0000 mg | Freq: Four times a day (QID) | INTRAVENOUS | Status: DC | PRN
  Filled 2020-04-21: qty 5

## 2020-04-21 MED ORDER — ATORVASTATIN CALCIUM 40 MG PO TABS
40.0000 mg | ORAL_TABLET | Freq: Every day | ORAL | Status: DC
  Administered 2020-04-22: 40 mg via ORAL
  Filled 2020-04-21: qty 1

## 2020-04-21 MED ORDER — BUPIVACAINE-EPINEPHRINE (PF) 0.25% -1:200000 IJ SOLN
INTRAMUSCULAR | Status: AC
  Filled 2020-04-21: qty 30

## 2020-04-21 MED ORDER — 0.9 % SODIUM CHLORIDE (POUR BTL) OPTIME
TOPICAL | Status: DC | PRN
  Administered 2020-04-21 (×3): 1000 mL

## 2020-04-21 MED ORDER — DEXMEDETOMIDINE (PRECEDEX) IN NS 20 MCG/5ML (4 MCG/ML) IV SYRINGE
PREFILLED_SYRINGE | INTRAVENOUS | Status: AC
  Filled 2020-04-21: qty 5

## 2020-04-21 MED ORDER — ONDANSETRON HCL 4 MG/2ML IJ SOLN: 4.0000 mg | Freq: Once | INTRAMUSCULAR | Status: DC | PRN

## 2020-04-21 MED ORDER — ORAL CARE MOUTH RINSE: 15.0000 mL | Freq: Once | OROMUCOSAL | Status: AC

## 2020-04-21 MED ORDER — SUGAMMADEX SODIUM 200 MG/2ML IV SOLN
INTRAVENOUS | Status: DC | PRN
  Administered 2020-04-21: 220 mg via INTRAVENOUS

## 2020-04-21 MED ORDER — GLYCOPYRROLATE PF 0.2 MG/ML IJ SOSY
PREFILLED_SYRINGE | INTRAMUSCULAR | Status: AC
  Filled 2020-04-21: qty 1

## 2020-04-21 MED ORDER — MIDAZOLAM HCL 2 MG/2ML IJ SOLN
INTRAMUSCULAR | Status: AC
  Filled 2020-04-21: qty 2

## 2020-04-21 MED ORDER — CEFAZOLIN SODIUM-DEXTROSE 2-4 GM/100ML-% IV SOLN
2.0000 g | INTRAVENOUS | Status: AC
  Administered 2020-04-21 (×2): 2 g via INTRAVENOUS
  Filled 2020-04-21: qty 100

## 2020-04-21 MED ORDER — HYDROCODONE-ACETAMINOPHEN 7.5-325 MG PO TABS
2.0000 | ORAL_TABLET | ORAL | Status: DC | PRN
Start: 2020-04-21 — End: 2020-04-22
  Administered 2020-04-21 – 2020-04-22 (×5): 2 via ORAL
  Filled 2020-04-21 (×5): qty 2

## 2020-04-21 MED ORDER — OXYCODONE HCL 5 MG/5ML PO SOLN: 5.0000 mg | Freq: Once | ORAL | Status: DC | PRN

## 2020-04-21 MED ORDER — DOCUSATE SODIUM 100 MG PO CAPS
100.0000 mg | ORAL_CAPSULE | Freq: Two times a day (BID) | ORAL | Status: DC
  Administered 2020-04-21 – 2020-04-22 (×2): 100 mg via ORAL
  Filled 2020-04-21 (×2): qty 1

## 2020-04-21 MED ORDER — ACETAMINOPHEN 325 MG PO TABS: 650.0000 mg | ORAL_TABLET | ORAL | Status: DC | PRN

## 2020-04-21 MED ORDER — CEFAZOLIN SODIUM 1 G IJ SOLR
INTRAMUSCULAR | Status: AC
  Filled 2020-04-21: qty 20

## 2020-04-21 MED ORDER — FLEET ENEMA 7-19 GM/118ML RE ENEM: 1.0000 | ENEMA | Freq: Once | RECTAL | Status: DC | PRN

## 2020-04-21 MED ORDER — ONDANSETRON HCL 4 MG PO TABS: 4.0000 mg | ORAL_TABLET | Freq: Four times a day (QID) | ORAL | Status: DC | PRN

## 2020-04-21 MED ORDER — BISACODYL 5 MG PO TBEC: 5.0000 mg | DELAYED_RELEASE_TABLET | Freq: Every day | ORAL | Status: DC | PRN

## 2020-04-21 MED ORDER — METHYLENE BLUE 0.5 % INJ SOLN
INTRAVENOUS | Status: DC | PRN
  Administered 2020-04-21: 1 mL via SUBMUCOSAL

## 2020-04-21 MED ORDER — LIDOCAINE 2% (20 MG/ML) 5 ML SYRINGE
INTRAMUSCULAR | Status: DC | PRN
  Administered 2020-04-21: 100 mg via INTRAVENOUS

## 2020-04-21 MED ORDER — POVIDONE-IODINE 7.5 % EX SOLN
Freq: Once | CUTANEOUS | Status: DC
  Filled 2020-04-21: qty 118

## 2020-04-21 MED ORDER — THROMBIN (RECOMBINANT) 20000 UNITS EX SOLR
CUTANEOUS | Status: AC
  Filled 2020-04-21: qty 20000

## 2020-04-21 MED ORDER — FENTANYL CITRATE (PF) 250 MCG/5ML IJ SOLN
INTRAMUSCULAR | Status: DC | PRN
  Administered 2020-04-21: 50 ug via INTRAVENOUS
  Administered 2020-04-21 (×2): 100 ug via INTRAVENOUS

## 2020-04-21 MED ORDER — LOSARTAN POTASSIUM 50 MG PO TABS
100.0000 mg | ORAL_TABLET | Freq: Every day | ORAL | Status: DC
  Administered 2020-04-22: 100 mg via ORAL
  Filled 2020-04-21: qty 2

## 2020-04-21 MED ORDER — MENTHOL 3 MG MT LOZG: 1.0000 | LOZENGE | OROMUCOSAL | Status: DC | PRN

## 2020-04-21 MED ORDER — BUPIVACAINE-EPINEPHRINE 0.25% -1:200000 IJ SOLN
INTRAMUSCULAR | Status: DC | PRN
  Administered 2020-04-21: 20 mL

## 2020-04-21 MED ORDER — POTASSIUM CHLORIDE IN NACL 20-0.9 MEQ/L-% IV SOLN: INTRAVENOUS | Status: DC

## 2020-04-21 MED ORDER — MIDAZOLAM HCL 5 MG/5ML IJ SOLN
INTRAMUSCULAR | Status: DC | PRN
  Administered 2020-04-21 (×2): 1 mg via INTRAVENOUS

## 2020-04-21 MED ORDER — KETAMINE HCL 50 MG/5ML IJ SOSY
PREFILLED_SYRINGE | INTRAMUSCULAR | Status: AC
  Filled 2020-04-21: qty 5

## 2020-04-21 MED ORDER — DEXMEDETOMIDINE (PRECEDEX) IN NS 20 MCG/5ML (4 MCG/ML) IV SYRINGE
PREFILLED_SYRINGE | INTRAVENOUS | Status: DC | PRN
  Administered 2020-04-21: 4 ug via INTRAVENOUS
  Administered 2020-04-21: 12 ug via INTRAVENOUS
  Administered 2020-04-21: 4 ug via INTRAVENOUS

## 2020-04-21 MED ORDER — LOSARTAN POTASSIUM-HCTZ 100-25 MG PO TABS: 1.0000 | ORAL_TABLET | Freq: Every day | ORAL | Status: DC

## 2020-04-21 MED ORDER — PHENYLEPHRINE 40 MCG/ML (10ML) SYRINGE FOR IV PUSH (FOR BLOOD PRESSURE SUPPORT)
PREFILLED_SYRINGE | INTRAVENOUS | Status: DC | PRN
  Administered 2020-04-21 (×5): 80 ug via INTRAVENOUS

## 2020-04-21 MED ORDER — PHENYLEPHRINE HCL-NACL 10-0.9 MG/250ML-% IV SOLN
INTRAVENOUS | Status: AC
  Filled 2020-04-21: qty 500

## 2020-04-21 MED ORDER — EMPAGLIFLOZIN 10 MG PO TABS
10.0000 mg | ORAL_TABLET | Freq: Every day | ORAL | Status: DC
  Administered 2020-04-22: 10 mg via ORAL
  Filled 2020-04-21: qty 1

## 2020-04-21 MED ORDER — BUPIVACAINE LIPOSOME 1.3 % IJ SUSP
INTRAMUSCULAR | Status: DC | PRN
  Administered 2020-04-21: 20 mL

## 2020-04-21 MED ORDER — OXYCODONE HCL 5 MG PO TABS: 5.0000 mg | ORAL_TABLET | Freq: Once | ORAL | Status: DC | PRN

## 2020-04-21 MED ORDER — METHOCARBAMOL 500 MG PO TABS
ORAL_TABLET | ORAL | Status: AC
  Filled 2020-04-21: qty 1

## 2020-04-21 MED ORDER — PROPOFOL 1000 MG/100ML IV EMUL
INTRAVENOUS | Status: AC
  Filled 2020-04-21: qty 100

## 2020-04-21 MED ORDER — CEFAZOLIN SODIUM-DEXTROSE 2-4 GM/100ML-% IV SOLN
2.0000 g | Freq: Three times a day (TID) | INTRAVENOUS | Status: AC
  Administered 2020-04-21 – 2020-04-22 (×2): 2 g via INTRAVENOUS
  Filled 2020-04-21 (×2): qty 100

## 2020-04-21 SURGICAL SUPPLY — 85 items
AGENT HMST KT MTR STRL THRMB (HEMOSTASIS)
APL SKNCLS STERI-STRIP NONHPOA (GAUZE/BANDAGES/DRESSINGS) ×1
BENZOIN TINCTURE PRP APPL 2/3 (GAUZE/BANDAGES/DRESSINGS) ×2 IMPLANT
BLADE CLIPPER SURG (BLADE) ×1 IMPLANT
BONE VIVIGEN FORMABLE 10CC (Bone Implant) ×2 IMPLANT
BUR PRESCISION 1.7 ELITE (BURR) ×2 IMPLANT
BUR ROUND FLUTED 5 RND (BURR) ×2 IMPLANT
BUR ROUND PRECISION 4.0 (BURR) ×1 IMPLANT
BUR SABER RD CUTTING 3.0 (BURR) ×1 IMPLANT
CAGE SABLE 10X26 8D (Cage) ×1 IMPLANT
CARTRIDGE OIL MAESTRO DRILL (MISCELLANEOUS) ×1 IMPLANT
CLSR STERI-STRIP ANTIMIC 1/2X4 (GAUZE/BANDAGES/DRESSINGS) ×1 IMPLANT
CNTNR URN SCR LID CUP LEK RST (MISCELLANEOUS) ×1 IMPLANT
CONT SPEC 4OZ STRL OR WHT (MISCELLANEOUS) ×2
COVER BACK TABLE 60X90IN (DRAPES) ×2 IMPLANT
COVER MAYO STAND STRL (DRAPES) ×4 IMPLANT
COVER SURGICAL LIGHT HANDLE (MISCELLANEOUS) ×2 IMPLANT
COVER WAND RF STERILE (DRAPES) ×2 IMPLANT
DIFFUSER DRILL AIR PNEUMATIC (MISCELLANEOUS) ×2 IMPLANT
DRAPE C-ARM 42X72 X-RAY (DRAPES) ×2 IMPLANT
DRAPE C-ARMOR (DRAPES) ×1 IMPLANT
DRAPE POUCH INSTRU U-SHP 10X18 (DRAPES) ×2 IMPLANT
DRAPE SURG 17X23 STRL (DRAPES) ×8 IMPLANT
DURAPREP 26ML APPLICATOR (WOUND CARE) ×2 IMPLANT
ELECT BLADE 4.0 EZ CLEAN MEGAD (MISCELLANEOUS) ×2
ELECT CAUTERY BLADE 6.4 (BLADE) ×2 IMPLANT
ELECT REM PT RETURN 9FT ADLT (ELECTROSURGICAL) ×2
ELECTRODE BLDE 4.0 EZ CLN MEGD (MISCELLANEOUS) ×1 IMPLANT
ELECTRODE REM PT RTRN 9FT ADLT (ELECTROSURGICAL) ×1 IMPLANT
FILTER STRAW FLUID ASPIR (MISCELLANEOUS) ×2 IMPLANT
GAUZE 4X4 16PLY RFD (DISPOSABLE) ×2 IMPLANT
GAUZE SPONGE 4X4 12PLY STRL (GAUZE/BANDAGES/DRESSINGS) ×2 IMPLANT
GLOVE BIO SURGEON STRL SZ7 (GLOVE) ×2 IMPLANT
GLOVE BIO SURGEON STRL SZ8 (GLOVE) ×2 IMPLANT
GLOVE SRG 8 PF TXTR STRL LF DI (GLOVE) ×1 IMPLANT
GLOVE SURG UNDER POLY LF SZ7 (GLOVE) ×2 IMPLANT
GLOVE SURG UNDER POLY LF SZ8 (GLOVE) ×2
GOWN STRL REUS W/ TWL LRG LVL3 (GOWN DISPOSABLE) ×2 IMPLANT
GOWN STRL REUS W/ TWL XL LVL3 (GOWN DISPOSABLE) ×1 IMPLANT
GOWN STRL REUS W/TWL LRG LVL3 (GOWN DISPOSABLE) ×4
GOWN STRL REUS W/TWL XL LVL3 (GOWN DISPOSABLE) ×2
GRAFT BNE MATRIX VG FRMBL L 10 (Bone Implant) IMPLANT
IV CATH 14GX2 1/4 (CATHETERS) ×2 IMPLANT
KIT BASIN OR (CUSTOM PROCEDURE TRAY) ×2 IMPLANT
KIT POSITION SURG JACKSON T1 (MISCELLANEOUS) ×2 IMPLANT
KIT TURNOVER KIT B (KITS) ×2 IMPLANT
MARKER SKIN DUAL TIP RULER LAB (MISCELLANEOUS) ×4 IMPLANT
NDL 18GX1X1/2 (RX/OR ONLY) (NEEDLE) ×1 IMPLANT
NDL HYPO 25GX1X1/2 BEV (NEEDLE) ×1 IMPLANT
NDL SPNL 18GX3.5 QUINCKE PK (NEEDLE) ×2 IMPLANT
NEEDLE 18GX1X1/2 (RX/OR ONLY) (NEEDLE) ×2 IMPLANT
NEEDLE 22X1 1/2 (OR ONLY) (NEEDLE) ×4 IMPLANT
NEEDLE HYPO 25GX1X1/2 BEV (NEEDLE) ×2 IMPLANT
NEEDLE SPNL 18GX3.5 QUINCKE PK (NEEDLE) ×4 IMPLANT
NS IRRIG 1000ML POUR BTL (IV SOLUTION) ×2 IMPLANT
OIL CARTRIDGE MAESTRO DRILL (MISCELLANEOUS) ×2
PACK LAMINECTOMY ORTHO (CUSTOM PROCEDURE TRAY) ×2 IMPLANT
PACK UNIVERSAL I (CUSTOM PROCEDURE TRAY) ×2 IMPLANT
PAD ARMBOARD 7.5X6 YLW CONV (MISCELLANEOUS) ×4 IMPLANT
PATTIES SURGICAL .5 X1 (DISPOSABLE) ×2 IMPLANT
PATTIES SURGICAL .5X1.5 (GAUZE/BANDAGES/DRESSINGS) ×2 IMPLANT
ROD PRE BENT EXP 40MM (Rod) ×2 IMPLANT
SCREW SET SINGLE INNER (Screw) ×4 IMPLANT
SCREW VIPER CORT FIX 6.00X30 (Screw) ×4 IMPLANT
SPONGE INTESTINAL PEANUT (DISPOSABLE) ×2 IMPLANT
SPONGE SURGIFOAM ABS GEL 100 (HEMOSTASIS) ×2 IMPLANT
STRIP CLOSURE SKIN 1/2X4 (GAUZE/BANDAGES/DRESSINGS) ×4 IMPLANT
SURGIFLO W/THROMBIN 8M KIT (HEMOSTASIS) IMPLANT
SUT MNCRL AB 4-0 PS2 18 (SUTURE) ×2 IMPLANT
SUT VIC AB 0 CT1 18XCR BRD 8 (SUTURE) ×1 IMPLANT
SUT VIC AB 0 CT1 8-18 (SUTURE) ×2
SUT VIC AB 1 CT1 18XCR BRD 8 (SUTURE) ×1 IMPLANT
SUT VIC AB 1 CT1 8-18 (SUTURE) ×2
SUT VIC AB 2-0 CT2 18 VCP726D (SUTURE) ×2 IMPLANT
SYR 20ML LL LF (SYRINGE) ×4 IMPLANT
SYR BULB IRRIG 60ML STRL (SYRINGE) ×2 IMPLANT
SYR CONTROL 10ML LL (SYRINGE) ×4 IMPLANT
SYR TB 1ML LUER SLIP (SYRINGE) ×2 IMPLANT
TAP EXPEDIUM DL 4.35 (INSTRUMENTS) ×1 IMPLANT
TAP EXPEDIUM DL 5.0 (INSTRUMENTS) ×1 IMPLANT
TAP EXPEDIUM DL 6.0 (INSTRUMENTS) ×1 IMPLANT
TAPE CLOTH SURG 4X10 WHT LF (GAUZE/BANDAGES/DRESSINGS) ×1 IMPLANT
TRAY FOLEY MTR SLVR 16FR STAT (SET/KITS/TRAYS/PACK) ×2 IMPLANT
WATER STERILE IRR 1000ML POUR (IV SOLUTION) ×2 IMPLANT
YANKAUER SUCT BULB TIP NO VENT (SUCTIONS) ×2 IMPLANT

## 2020-04-21 NOTE — Transfer of Care (Signed)
Immediate Anesthesia Transfer of Care Note  Patient: Henry Young  Procedure(s) Performed: LEFT-SIDED LUMBAR 4 - LUMBAR 5 TRANSFORAMINAL LUMBAR INTERBODY FUSION WITH INSTRUMENTATION AND ALLOGRAFT (Left )  Patient Location: PACU  Anesthesia Type:General  Level of Consciousness: drowsy and responds to stimulation  Airway & Oxygen Therapy: Patient Spontanous Breathing and Patient connected to face mask oxygen  Post-op Assessment: Report given to RN and Post -op Vital signs reviewed and stable  Post vital signs: Reviewed and stable  Last Vitals:  Vitals Value Taken Time  BP 142/82 04/21/20 1214  Temp    Pulse 87 04/21/20 1215  Resp 14 04/21/20 1215  SpO2 100 % 04/21/20 1215  Vitals shown include unvalidated device data.  Last Pain:  Vitals:   04/21/20 0559  TempSrc: Oral         Complications: No complications documented.

## 2020-04-21 NOTE — H&P (Signed)
PREOPERATIVE H&P  Chief Complaint: Left leg pain  HPI: Henry Young is a 50 y.o. male who presents with ongoing pain in the left leg  MRI reveals stenosis and instability at L4/5  Patient has failed multiple forms of conservative care and continues to have pain (see office notes for additional details regarding the patient's full course of treatment)  Past Medical History:  Diagnosis Date  . Diabetes mellitus without complication ([place])    pre diabetes  . Hyperlipidemia   . Hypertension   . Neuromuscular disorder ([facility])    sciatica  . Pre-diabetes    Past Surgical History:  Procedure Laterality Date  . WISDOM TOOTH EXTRACTION     Social History   Socioeconomic History  . Marital status: Single    Spouse name: Not on file  . Number of children: 8  . Years of education: 20  . Highest education level: Not on file  Occupational History  . Occupation: [employer]   Tobacco Use  . Smoking status: Never Smoker  . Smokeless tobacco: Never Used  Vaping Use  . Vaping Use: Never used  Substance and Sexual Activity  . Alcohol use: Yes    Comment: 6 pack per week , occasional shots   . Drug use: Yes    Types: Marijuana  . Sexual activity: Yes  Other Topics Concern  . Not on file  Social History Narrative   Right handed   One story home   Social Determinants of Health   Financial Resource Strain: Not on file  Food Insecurity: Not on file  Transportation Needs: Not on file  Physical Activity: Not on file  Stress: Not on file  Social Connections: Not on file   Family History  Problem Relation Age of Onset  . Heart disease Mother   . Colon polyps Mother   . Prostate cancer Neg Hx   . Colon cancer Neg Hx   . Esophageal cancer Neg Hx   . Rectal cancer Neg Hx    No Known Allergies Prior to Admission medications   Medication Sig Start Date End Date Taking? Authorizing Provider  atorvastatin (LIPITOR) 40 MG tablet TAKE 1 TABLET BY MOUTH EVERY DAY Patient  taking differently: Take 40 mg by mouth daily. 10/13/19  Yes Vivi Barrack, MD  empagliflozin (JARDIANCE) 10 MG TABS tablet TAKE 10 MG BY MOUTH DAILY BEFORE BREAKFAST. Patient taking differently: Take 10 mg by mouth daily. TAKE 10 MG BY MOUTH DAILY BEFORE BREAKFAST. 03/28/20  Yes Vivi Barrack, MD  losartan-hydrochlorothiazide (HYZAAR) 100-25 MG tablet TAKE 1 TABLET BY MOUTH EVERY DAY Patient taking differently: Take 1 tablet by mouth daily. 02/15/20  Yes Vivi Barrack, MD  gabapentin (NEURONTIN) 300 MG capsule Take 1 capsule (300 mg total) by mouth 3 (three) times daily as needed. Patient not taking: Reported on 04/07/2020 10/19/19   Gregor Hams, MD  hydrochlorothiazide (HYDRODIURIL) 25 MG tablet Take 1 tablet (25 mg total) by mouth daily. Patient not taking: Reported on 04/07/2020 01/28/20   Vivi Barrack, MD  ibuprofen (ADVIL) 800 MG tablet TAKE 1 TABLET BY MOUTH EVERY 8 HOURS AS NEEDED Patient not taking: Reported on 04/07/2020 09/07/19   Vivi Barrack, MD  meloxicam (MOBIC) 15 MG tablet Take 1 tablet (15 mg total) by mouth daily. Patient not taking: No sig reported 05/27/19   Edrick Kins, DPM  tiZANidine (ZANAFLEX) 4 MG tablet Take 1 tablet (4 mg total) by mouth every 8 (eight)  hours as needed for muscle spasms. Patient not taking: No sig reported 10/01/19   Gregor Hams, MD  traZODone (DESYREL) 50 MG tablet TAKE 0.5-1 TABLETS BY MOUTH AT BEDTIME AS NEEDED FOR SLEEP. Patient not taking: Reported on 04/07/2020 03/28/20   Vivi Barrack, MD     All other systems have been reviewed and were otherwise negative with the exception of those mentioned in the HPI and as above.  Physical Exam: Vitals:   04/21/20 0559  BP: (!) 151/100  Pulse: 93  Resp: 18  Temp: 97.9 F (36.6 C)  SpO2: 97%    Body mass index is 31.66 kg/m.  General: Alert, no acute distress Cardiovascular: No pedal edema Respiratory: No cyanosis, no use of accessory musculature Skin: No lesions in the area of  chief complaint Neurologic: Sensation intact distally Psychiatric: Patient is competent for consent with normal mood and affect Lymphatic: No axillary or cervical lymphadenopathy  MUSCULOSKELETAL: + SLR on the left  Assessment/Plan: LEFT LUMBAR 5 RADICULOPATHY Plan for Procedure(s): LEFT-SIDED LUMBAR 4 - LUMBAR 5 TRANSFORAMINAL LUMBAR INTERBODY FUSION WITH INSTRUMENTATION AND ALLOGRAFT   Norva Karvonen, MD 04/21/2020 6:23 AM

## 2020-04-21 NOTE — Anesthesia Postprocedure Evaluation (Signed)
Anesthesia Post Note  Patient: Henry Young  Procedure(s) Performed: LEFT-SIDED LUMBAR 4 - LUMBAR 5 TRANSFORAMINAL LUMBAR INTERBODY FUSION WITH INSTRUMENTATION AND ALLOGRAFT (Left )     Patient location during evaluation: PACU Anesthesia Type: General Level of consciousness: awake and alert Pain management: pain level controlled Vital Signs Assessment: post-procedure vital signs reviewed and stable Respiratory status: spontaneous breathing, nonlabored ventilation and respiratory function stable Cardiovascular status: blood pressure returned to baseline and stable Postop Assessment: no apparent nausea or vomiting Anesthetic complications: no   No complications documented.  Last Vitals:  Vitals:   04/21/20 1322 04/21/20 1623  BP: 125/78 119/72  Pulse: 62 74  Resp: 18 18  Temp: (!) 36.4 C 36.4 C  SpO2: 94% 98%    Last Pain:  Vitals:   04/21/20 1437  TempSrc:   PainSc: 6                  Lidia Collum

## 2020-04-21 NOTE — Anesthesia Procedure Notes (Signed)
Procedure Name: Intubation Date/Time: 04/21/2020 7:52 AM Performed by: Hendricks Limes, CRNA Pre-anesthesia Checklist: Patient identified, Emergency Drugs available, Suction available and Patient being monitored Patient Re-evaluated:Patient Re-evaluated prior to induction Oxygen Delivery Method: Circle system utilized Preoxygenation: Pre-oxygenation with 100% oxygen Induction Type: IV induction Ventilation: Mask ventilation without difficulty Laryngoscope Size: Mac and 4 Grade View: Grade II Tube type: Oral Tube size: 7.5 mm Number of attempts: 1 Airway Equipment and Method: Stylet Placement Confirmation: ETT inserted through vocal cords under direct vision,  positive ETCO2 and breath sounds checked- equal and bilateral Secured at: 22 cm Tube secured with: Tape Dental Injury: Teeth and Oropharynx as per pre-operative assessment

## 2020-04-21 NOTE — Op Note (Signed)
PATIENT NAME: Henry Young //  MEDICAL RECORD NO.:   [id number]   DATE OF BIRTH: [birth year]   DATE OF PROCEDURE: 04/21/2020                               OPERATIVE REPORT     PREOPERATIVE DIAGNOSES: 1. Left-sided lumbar radiculopathy. 2. L4-5 spinal stenosis. 3. 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 Left-sided lumbar radiculopathy. 2. L4-5 spinal stenosis. 3. 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 L4/5 decompression 2. Left-sided L4-5 transforaminal lumbar interbody fusion. 3. Right-sided L4-5 posterolateral fusion. 4. Insertion of interbody device x1 (globus expandable intervertebral spacer). 5. Placement of segmental posterior instrumentation L4, L5 bilaterally  6. Use of local autograft. 7. Use of morselized allograft - Vivigen 8. Intraoperative use of fluoroscopy.   SURGEON:  Phylliss Bob, MD.   ASSISTANTPricilla Holm, PA-C.   ANESTHESIA:  General endotracheal anesthesia.   COMPLICATIONS:  None.   DISPOSITION:  Stable.   ESTIMATED BLOOD LOSS:  100cc   INDICATIONS FOR SURGERY:  Briefly, Mr. Henry Young is a pleasant 50 year old male who did present to me with severe and ongoing pain in the left leg. I did feel that the symptoms were secondary to the findings noted above.   The patient failed conservative care and did wish to proceed with the procedure  noted above.   OPERATIVE DETAILS:  On 04/21/2020, the patient was brought to surgery and general endotracheal anesthesia was administered.  The patient was placed prone on a well-padded flat Jackson bed with a spinal frame.  Antibiotics were given and a time-out procedure was performed. The back was prepped and draped in the usual fashion.  A midline incision was made overlying the L4-5 intervertebral spaces.  The fascia was incised at the midline.  The paraspinal musculature was bluntly swept laterally.  Anatomic landmarks for the pedicles were exposed. Using fluoroscopy, I did cannulate the L4 and L5  pedicles bilaterally, using a medial to lateral cortical trajectory technique.  At this point, 6 x 30 mm screws were placed into the right pedicles, and a 40 mm rod was placed into the tulip heads of the screw, and caps were also placed.  Distraction was then applied across the L4-5 intervertebral space, and the caps were then provisionally tightened.  On the left side, bone wax was placed into the cannulated pedicle holes.  I then proceeded with the decompressive aspect of the procedure at the L4-5 level.  A partial facetectomy was performed bilaterally at L4-5, decompressing the L4-5 intervertebral space.  I was very pleased with the decompression. With an assistant holding medial retraction of the traversing left L5 nerve, I did perform an annulotomy at the posterolateral aspect of the L4-5 intervertebral space.  I then used a series of curettes and pituitary rongeurs to perform a thorough and complete intervertebral diskectomy.  The intervertebral space was then liberally packed with autograft as well as allograft in the form of Vivigen, as was the appropriate-sized intervertebral spacer.  The spacer was then tamped into position in the usual fashion.  Of note, this was an expandable spacer.  The spacer was expanded to approximately 12.3 mm in height. I was very pleased with the press-fit of the spacer.  I then placed 6 mm screws on the left at L4 and L5. A 40-mm rod was then placed and caps were placed. The distraction was then released on the contralateral side.  All caps  were then locked.  The wound was copiously irrigated with a total of approximately 3 L prior to placing the bone graft.  Additional autograft and allograft was then packed into the posterolateral gutter on the right side to help aid in the success of the fusion.  The wound was  explored for any undue bleeding and there was no substantial bleeding encountered.  Gel-Foam was placed over the laminectomy site.  The wound was then  closed in layers using #1 Vicryl followed by 2-0 Vicryl, followed by 4-0 Monocryl.  Benzoin and Steri-Strips were applied followed by sterile dressing.     Of note, Pricilla Holm was my assistant throughout surgery, and did aid in retraction, suctioning, placement of the hardware, and closure.       Phylliss Bob, MD

## 2020-04-22 ENCOUNTER — Encounter (HOSPITAL_COMMUNITY): Payer: Self-pay | Admitting: Orthopedic Surgery

## 2020-04-22 LAB — GLUCOSE, CAPILLARY: Glucose-Capillary: 117 mg/dL — ABNORMAL HIGH (ref 70–99)

## 2020-04-22 MED ORDER — OXYCODONE HCL 5 MG PO TABS: 5.0000 mg | ORAL_TABLET | ORAL | Status: DC | PRN

## 2020-04-22 MED ORDER — METHOCARBAMOL 500 MG PO TABS: 500.0000 mg | ORAL_TABLET | Freq: Four times a day (QID) | ORAL | 1 refills | Status: AC | PRN

## 2020-04-22 MED ORDER — OXYCODONE-ACETAMINOPHEN 5-325 MG PO TABS: 1.0000 | ORAL_TABLET | ORAL | 0 refills | Status: AC | PRN

## 2020-04-22 MED FILL — Thrombin (Recombinant) For Soln 20000 Unit: CUTANEOUS | Qty: 1 | Status: AC

## 2020-04-22 NOTE — Evaluation (Signed)
Physical Therapy Evaluation and Discharge Patient Details Name: Henry Young MRN: [id number] DOB: 28-Jan-1970 Today's Date: 04/22/2020   History of Present Illness  Pt is a 50 y/o male who presents s/p L4-L5 TLIF on 04/21/2020. PMH significant for Diabetes mellitus, Hypertension.  Clinical Impression  Patient evaluated by Physical Therapy with no further acute PT needs identified. All education has been completed and the patient has no further questions. Pt was able to demonstrate transfers and ambulation with gross modified independence to independence and no AD. Pt was educated on precautions, brace application/wearing schedule, appropriate activity progression, and car transfer. See below for any follow-up Physical Therapy or equipment needs. PT is signing off. Thank you for this referral.     Follow Up Recommendations No PT follow up;Supervision - Intermittent    Equipment Recommendations  None recommended by PT    Recommendations for Other Services       Precautions / Restrictions Precautions Precautions: Back Precaution Booklet Issued: Yes (comment) Required Braces or Orthoses: Spinal Brace Spinal Brace: Thoracolumbosacral orthotic;Applied in standing position Restrictions Weight Bearing Restrictions: No      Mobility  Bed Mobility Overal bed mobility: Modified Independent             General bed mobility comments: Good log roll technique. Slow and effortful due to pain but no assist required.    Transfers Overall transfer level: Modified independent Equipment used: None             General transfer comment: Pt was able to power-up to full stand without difficulty. Good posture throughout with hand placement on seated suface for safety.  Ambulation/Gait Ambulation/Gait assistance: Modified independent (Device/Increase time) Gait Distance (Feet): 250 Feet Assistive device: None Gait Pattern/deviations: Step-through pattern;Decreased stride length Gait  velocity: Decreased Gait velocity interpretation: <1.31 ft/sec, indicative of household ambulator General Gait Details: Pt with good posture and forward gaze throughout gait training. Slow due to pain, and pt holding L hip>R hip where muscular pain/fatigue was reported to be most.  Financial trader Rankin (Stroke Patients Only)       Balance Overall balance assessment: Mild deficits observed, not formally tested                                           Pertinent Vitals/Pain Pain Assessment: Faces Faces Pain Scale: Hurts even more Pain Location: back and L leg Pain Descriptors / Indicators: Aching;Discomfort;Grimacing;Guarding Pain Intervention(s): Limited activity within patient's tolerance;Monitored during session;Repositioned    Home Living Family/patient expects to be discharged to:: Private residence Living Arrangements: Alone;Spouse/significant other Available Help at Discharge: Available PRN/intermittently   Home Access: Level entry     Home Layout: One level Home Equipment: Environmental consultant - 2 wheels      Prior Function Level of Independence: Independent         Comments: Works for a Scientist, physiological   Dominant Hand: Right    Extremity/Trunk Assessment   Upper Extremity Assessment Upper Extremity Assessment: Defer to OT evaluation    Lower Extremity Assessment Lower Extremity Assessment: Generalized weakness (Due to pain - consistent with above mentioned procedure.)    Cervical / Trunk Assessment Cervical / Trunk Assessment: Other exceptions Cervical / Trunk Exceptions: s/p surgery  Communication   Communication: No difficulties  Cognition Arousal/Alertness: Awake/alert Behavior During Therapy: WFL for tasks assessed/performed Overall Cognitive Status: Within Functional Limits for tasks assessed                                        General Comments       Exercises     Assessment/Plan    PT Assessment Patent does not need any further PT services  PT Problem List         PT Treatment Interventions      PT Goals (Current goals can be found in the Care Plan section)  Acute Rehab PT Goals Patient Stated Goal: Eventually get back to work PT Goal Formulation: All assessment and education complete, DC therapy    Frequency     Barriers to discharge        Co-evaluation               AM-PAC PT "6 Clicks" Mobility  Outcome Measure Help needed turning from your back to your side while in a flat bed without using bedrails?: None Help needed moving from lying on your back to sitting on the side of a flat bed without using bedrails?: None Help needed moving to and from a bed to a chair (including a wheelchair)?: None Help needed standing up from a chair using your arms (e.g., wheelchair or bedside chair)?: None Help needed to walk in hospital room?: None Help needed climbing 3-5 steps with a railing? : A Little 6 Click Score: 23    End of Session Equipment Utilized During Treatment: Gait belt Activity Tolerance: Patient tolerated treatment well Patient left: in bed;with call bell/phone within reach Nurse Communication: Mobility status PT Visit Diagnosis: Unsteadiness on feet (R26.81);Pain Pain - part of body:  (back/hips)    Time: 1005-1020 PT Time Calculation (min) (ACUTE ONLY): 15 min   Charges:   PT Evaluation $PT Eval Low Complexity: 1 Low          Rolinda Roan, PT, DPT Acute Rehabilitation Services Pager: [phone] Office: [phone]   Thelma Comp 04/22/2020, 1:35 PM

## 2020-04-22 NOTE — Progress Notes (Signed)
    Patient doing well  Patient reports moderate LBP Patient denies leg pain   Physical Exam: Vitals:   04/21/20 2318 04/22/20 0420  BP: 124/73 104/67  Pulse: 96 83  Resp: 18 20  Temp: 98.2 F (36.8 C) 97.8 F (36.6 C)  SpO2: 98% 96%    Dressing in place NVI  POD #1 s/p L4/5 decompression and fusion, doing well  - up with PT/OT, encourage ambulation - Percocet for pain, Robaxin for muscle spasms - likely d/c home today with f/u in 2 weeks

## 2020-04-22 NOTE — Progress Notes (Signed)
Patient is discharged from 3C08 at this time. Alert and in stable condition. IV site d/c'd and instructions read to patient and spouse with understanding verbalized and all questions answered. Left unit via wheelchair with all belongings at side.

## 2020-04-22 NOTE — Evaluation (Signed)
Occupational Therapy Evaluation Patient Details Name: Henry Young MRN: [id number] DOB: 1970/01/28 Today's Date: 04/22/2020    History of Present Illness 50 y.o. 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  Patient underwentL4/5 decompression, Left-sided L4-5 transforaminal lumbar interbody fusion, Right-sided L4-5 posterolateral fusion.  PMH: Diabetes mellitus without complication, Hyperlipidemia, Hypertension, Neuromuscular disorder sciatica.   Clinical Impression   Patient admitted for the diagnosis and procedure above.  PTA he lived alone, but has a SO that will be able to assist on a PRN basis.  Overall he is doing well, continues with pain that radiates down his L leg.  He was able to move in his room without an AD at a Mod I level, and he was able to complete ADL with hip kit.  Patient has a good understanding of all education, and all questions answered.  PT eval is pending.  No further OT needs in the acute setting.      Follow Up Recommendations  No OT follow up    Equipment Recommendations  None recommended by OT    Recommendations for Other Services       Precautions / Restrictions Precautions Precautions: Back Precaution Booklet Issued: Yes (comment) Required Braces or Orthoses: Spinal Brace Spinal Brace: Thoracolumbosacral orthotic;Applied in standing position Restrictions Weight Bearing Restrictions: No      Mobility Bed Mobility Overal bed mobility: Modified Independent                  Transfers Overall transfer level: Modified independent                    Balance Overall balance assessment: No apparent balance deficits (not formally assessed)                                         ADL either performed or assessed with clinical judgement   ADL Overall ADL's : Modified independent                                       General ADL Comments:  Able to use hip kit to complete LB ADL from sit/stand level.     Vision Baseline Vision/History: No visual deficits Patient Visual Report: No change from baseline                  Pertinent Vitals/Pain Pain Assessment: 0-10 Pain Score: 5  Pain Location: back and L leg Pain Descriptors / Indicators: Aching;Discomfort;Grimacing;Guarding Pain Intervention(s): Monitored during session     Hand Dominance Right   Extremity/Trunk Assessment Upper Extremity Assessment Upper Extremity Assessment: Overall WFL for tasks assessed   Lower Extremity Assessment Lower Extremity Assessment: Defer to PT evaluation   Cervical / Trunk Assessment Cervical / Trunk Assessment: Normal   Communication Communication Communication: No difficulties   Cognition Arousal/Alertness: Awake/alert Behavior During Therapy: WFL for tasks assessed/performed Overall Cognitive Status: Within Functional Limits for tasks assessed                                                      Home Living Family/patient expects to  be discharged to:: Private residence Living Arrangements: Alone;Spouse/significant other Available Help at Discharge: Available PRN/intermittently   Home Access: Level entry     [place]: One level     Bathroom Shower/Tub: Teacher, early years/pre: [place]: Environmental consultant - 2 wheels   Additional Comments: Patient ordered a hip kit.      Prior Functioning/Environment Level of Independence: Independent                 OT Problem List: Pain      OT Treatment/Interventions:      OT Goals(Current goals can be found in the care plan section) Acute Rehab OT Goals Patient Stated Goal: Be able to walk further without pain OT Goal Formulation: With patient Time For Goal Achievement: 04/22/20 Potential to Achieve Goals: Good  OT Frequency:     Barriers to D/C:  None noted          Co-evaluation               AM-PAC OT "6 Clicks" Daily Activity     Outcome Measure Help from another person eating meals?: None Help from another person taking care of personal grooming?: None Help from another person toileting, which includes using toliet, bedpan, or urinal?: None Help from another person bathing (including washing, rinsing, drying)?: None Help from another person to put on and taking off regular upper body clothing?: None Help from another person to put on and taking off regular lower body clothing?: None 6 Click Score: 24   End of Session Equipment Utilized During Treatment: Back brace Nurse Communication: Mobility status  Activity Tolerance: Patient tolerated treatment well Patient left: in bed;with call bell/phone within reach  OT Visit Diagnosis: Pain Pain - Right/Left: Left Pain - part of body: Leg                Time: 2482-5003 OT Time Calculation (min): 26 min Charges:  OT General Charges $OT Visit: 1 Visit OT Evaluation $OT Eval Moderate Complexity: 1 Mod OT Treatments $Self Care/Home Management : 8-22 mins  04/22/2020  Rich, OTR/L  Acute Rehabilitation Services  Office:  [phone]   Henry Young 04/22/2020, 8:39 AM

## 2020-04-27 ENCOUNTER — Encounter (HOSPITAL_COMMUNITY): Payer: Self-pay | Admitting: Orthopedic Surgery

## 2020-04-28 ENCOUNTER — Other Ambulatory Visit: Payer: Self-pay | Admitting: Family Medicine

## 2020-04-28 NOTE — Discharge Summary (Signed)
Patient ID: CHOICE KLEINSASSER MRN: [id number] DOB/AGE: 02-21-1970 50 y.o.  Admit date: 04/21/2020 Discharge date: 04/22/2020  Admission Diagnoses:  Active Problems:   Radiculopathy   Discharge Diagnoses:  Same  Past Medical History:  Diagnosis Date  . Diabetes mellitus without complication ([place])    pre diabetes  . Hyperlipidemia   . Hypertension   . Neuromuscular disorder ([facility])    sciatica  . Pre-diabetes     Surgeries: Procedure(s): LEFT-SIDED LUMBAR 4 - LUMBAR 5 TRANSFORAMINAL LUMBAR INTERBODY FUSION WITH INSTRUMENTATION AND ALLOGRAFT on 04/21/2020   Consultants: none  Discharged Condition: Improved  Hospital Course: Suleman D Burdi is an 50 y.o. male who was admitted 04/21/2020 for operative treatment of radiculopathy. Patient has severe unremitting pain that affects sleep, daily activities, and work/hobbies. After pre-op clearance the patient was taken to the operating room on 04/21/2020 and underwent  Procedure(s): LEFT-SIDED LUMBAR 4 - LUMBAR 5 TRANSFORAMINAL LUMBAR INTERBODY FUSION WITH INSTRUMENTATION AND ALLOGRAFT.    Patient was given perioperative antibiotics:  Anti-infectives (From admission, onward)   Start     Dose/Rate Route Frequency Ordered Stop   04/21/20 2000  ceFAZolin (ANCEF) IVPB 2g/100 mL premix        2 g 200 mL/hr over 30 Minutes Intravenous Every 8 hours 04/21/20 1333 04/22/20 1235   04/21/20 0600  ceFAZolin (ANCEF) IVPB 2g/100 mL premix        2 g 200 mL/hr over 30 Minutes Intravenous On call to O.R. 04/21/20 0541 04/21/20 1143       Patient was given sequential compression devices, early ambulation to prevent DVT.  Patient benefited maximally from hospital stay and there were no complications.    Recent vital signs: BP 111/69 (BP Location: Left Arm)   Pulse 100   Temp 97.9 F (36.6 C)   Resp 17   Ht 6\' 1"  (1.854 m)   Wt 108.9 kg   SpO2 98%   BMI 31.66 kg/m    Discharge Medications:   Allergies as of 04/22/2020   No Known  Allergies     Medication List    TAKE these medications   atorvastatin 40 MG tablet Commonly known as: LIPITOR TAKE 1 TABLET BY MOUTH EVERY DAY   empagliflozin 10 MG Tabs tablet Commonly known as: Jardiance TAKE 10 MG BY MOUTH DAILY BEFORE BREAKFAST. What changed:   how much to take  how to take this  when to take this   losartan-hydrochlorothiazide 100-25 MG tablet Commonly known as: HYZAAR TAKE 1 TABLET BY MOUTH EVERY DAY   methocarbamol 500 MG tablet Commonly known as: ROBAXIN Take 1 tablet (500 mg total) by mouth every 6 (six) hours as needed for muscle spasms.   oxyCODONE-acetaminophen 5-325 MG tablet Commonly known as: Percocet Take 1-2 tablets by mouth every 4 (four) hours as needed for severe pain.   traZODone 50 MG tablet Commonly known as: DESYREL TAKE 0.5-1 TABLETS BY MOUTH AT BEDTIME AS NEEDED FOR SLEEP.       Diagnostic Studies: DG Lumbar Spine 2-3 Views  Result Date: 04/21/2020 CLINICAL DATA:  Surgery, elective. Additional history provided: Left-sided lumbar 4-lumbar 5 transforaminal lumbar interbody fusion with instrumentation and allograft. Provided fluoroscopy time 53.7 seconds (44.53 mGy). EXAM: LUMBAR SPINE - 2-3 VIEW; DG C-ARM 1-60 MIN COMPARISON:  Localizer radiographs of the lumbar spine performed earlier today 04/21/2020. Lumbar spine radiographs 10/12/2019. FINDINGS: AP and lateral view intraoperative fluoroscopic images of the lumbar spine are submitted, 2 images total. The images demonstrate a posterior  spinal fusion construct at L4-L5 (bilateral pedicle screws and vertical interconnecting rods). An interbody device is also present at L4-L5. No unexpected finding. IMPRESSION: Two intraoperative fluoroscopic images of the lumbar spine from L4-L5 fusion, as described. Electronically Signed   By: Kellie Simmering DO   On: 04/21/2020 12:16   DG Lumbar Spine 1 View  Result Date: 04/21/2020 CLINICAL DATA:  Radiculopathy. Radiculopathy, unspecified spinal  region. Additional history provided: Left-sided lumbar 4-lumbar 5 transforaminal lumbar interbody fusion with instrumentation and allograft for left lumbar 5 radiculopathy. EXAM: LUMBAR SPINE - 1 VIEW COMPARISON:  Radiographs of the lumbar spine 10/12/2019. FINDINGS: A single lateral view localizer intraoperative radiograph of the lumbar spine is submitted. On the provided image, a metallic site markers project posterior to the L2 spinous process and posterior to the inferior aspect of the L4 spinous process. IMPRESSION: Single lateral view localizer intraoperative radiograph of the lumbar spine, as described. Electronically Signed   By: Kellie Simmering DO   On: 04/21/2020 12:18   DG C-Arm 1-60 Min  Result Date: 04/21/2020 CLINICAL DATA:  Surgery, elective. Additional history provided: Left-sided lumbar 4-lumbar 5 transforaminal lumbar interbody fusion with instrumentation and allograft. Provided fluoroscopy time 53.7 seconds (44.53 mGy). EXAM: LUMBAR SPINE - 2-3 VIEW; DG C-ARM 1-60 MIN COMPARISON:  Localizer radiographs of the lumbar spine performed earlier today 04/21/2020. Lumbar spine radiographs 10/12/2019. FINDINGS: AP and lateral view intraoperative fluoroscopic images of the lumbar spine are submitted, 2 images total. The images demonstrate a posterior spinal fusion construct at L4-L5 (bilateral pedicle screws and vertical interconnecting rods). An interbody device is also present at L4-L5. No unexpected finding. IMPRESSION: Two intraoperative fluoroscopic images of the lumbar spine from L4-L5 fusion, as described. Electronically Signed   By: Kellie Simmering DO   On: 04/21/2020 12:16    Disposition: Discharge disposition: 01-Home or Self Care        POD #1 s/p L4/5 decompression and fusion, doing well  - up with PT/OT, encourage ambulation - Percocet for pain, Robaxin for muscle spasms -Scripts for pain sent to pharmacy electronically  -D/C instructions sheet printed and in chart -D/C today   -F/U in office 2 weeks   Signed: Lennie Muckle Stepheny Canal 04/28/2020, 11:21 AM

## 2020-05-13 ENCOUNTER — Other Ambulatory Visit: Payer: Self-pay | Admitting: Family Medicine

## 2020-05-18 NOTE — Telephone Encounter (Signed)
Left message to return call to our office at their convenience and schedule appointment with PCP form medication refills

## 2020-11-06 ENCOUNTER — Other Ambulatory Visit: Payer: Self-pay | Admitting: Family Medicine

## 2020-11-08 ENCOUNTER — Other Ambulatory Visit: Payer: Self-pay | Admitting: Family Medicine

## 2020-12-29 ENCOUNTER — Ambulatory Visit (INDEPENDENT_AMBULATORY_CARE_PROVIDER_SITE_OTHER): Payer: BLUE CROSS/BLUE SHIELD | Admitting: Podiatry

## 2020-12-29 ENCOUNTER — Ambulatory Visit (INDEPENDENT_AMBULATORY_CARE_PROVIDER_SITE_OTHER): Payer: BLUE CROSS/BLUE SHIELD

## 2020-12-29 ENCOUNTER — Other Ambulatory Visit: Payer: Self-pay

## 2020-12-29 DIAGNOSIS — M2141 Flat foot [pes planus] (acquired), right foot: Secondary | ICD-10-CM

## 2020-12-29 DIAGNOSIS — M7750 Other enthesopathy of unspecified foot: Secondary | ICD-10-CM

## 2020-12-29 DIAGNOSIS — M2142 Flat foot [pes planus] (acquired), left foot: Secondary | ICD-10-CM

## 2020-12-29 DIAGNOSIS — M7662 Achilles tendinitis, left leg: Secondary | ICD-10-CM

## 2020-12-29 NOTE — Patient Instructions (Signed)

## 2020-12-30 MED ORDER — METHYLPREDNISOLONE 4 MG PO TBPK: ORAL_TABLET | ORAL | 0 refills | Status: AC

## 2020-12-30 MED ORDER — MELOXICAM 15 MG PO TABS
15.0000 mg | ORAL_TABLET | Freq: Every day | ORAL | 3 refills | Status: AC
Start: 2020-12-30 — End: ?

## 2020-12-30 NOTE — Progress Notes (Signed)
°  Subjective:  Patient ID: Henry Young, male    DOB: 1970/04/29,  MRN: [id number]  Chief Complaint  Patient presents with   Foot Pain    Left heel pain x 4 days, back of heel    50 y.o. male presents with the above complaint. History confirmed with patient.  Recent started working on his feet more and think this may have sprained it.  He also had previously had orthotics and would like to get new orthotics made.  Objective:  Physical Exam: warm, good capillary refill, no trophic changes or ulcerative lesions, normal DP and PT pulses, and normal sensory exam.  Pes planus deformity noted Left Foot: tenderness at Achilles tendon insertion and gastrocnemius equinus is noted with a positive silverskiold test Radiographs: Multiple views x-ray of the left foot: no fracture, dislocation, swelling or degenerative changes noted and posterior calcaneal spur Assessment:   1. Achilles tendinitis of left lower extremity      Plan:  Patient was evaluated and treated and all questions answered.  Discussed the etiology and treatment options for Achilles tendinitis including stretching, formal physical therapy with an eccentric exercises therapy plan, supportive shoegears such as a running shoe or sneaker, heel lifts, topical and oral medications.  We also discussed that I do not routinely perform injections in this area because of the risk of an increased damage or rupture of the tendon.  We also discussed the role of surgical treatment of this for patients who do not improve after exhausting non-surgical treatment options.  -XR reviewed with patient -Educated on stretching and icing of the affected limb. -Rx for Medrol and Meloxicam. Advised to start Meloxicam the day after completion of oral steroid taper. -Heel lifts dispensed and he will wear this in his work boots.  We discussed putting him in a cam boot for mobilization but this would take him out of work. -Also has benefited for his pes  planus from custom molded orthoses before.  He would like a new pair made.  I am having him referred to our orthotist Aaron Edelman Little for evaluation.  Return in about 1 month (around 01/29/2021) for re-check Achilles tendon.

## 2021-01-10 ENCOUNTER — Ambulatory Visit: Payer: Self-pay

## 2021-01-10 ENCOUNTER — Ambulatory Visit (INDEPENDENT_AMBULATORY_CARE_PROVIDER_SITE_OTHER): Payer: BLUE CROSS/BLUE SHIELD | Admitting: Podiatry

## 2021-01-10 ENCOUNTER — Other Ambulatory Visit: Payer: Self-pay

## 2021-01-10 DIAGNOSIS — M2141 Flat foot [pes planus] (acquired), right foot: Secondary | ICD-10-CM

## 2021-01-10 DIAGNOSIS — M722 Plantar fascial fibromatosis: Secondary | ICD-10-CM

## 2021-01-10 DIAGNOSIS — M7662 Achilles tendinitis, left leg: Secondary | ICD-10-CM

## 2021-01-10 DIAGNOSIS — M2142 Flat foot [pes planus] (acquired), left foot: Secondary | ICD-10-CM

## 2021-01-10 NOTE — Patient Instructions (Signed)

## 2021-01-11 NOTE — Progress Notes (Signed)
°  Subjective:  Patient ID: Henry Young, male    DOB: 1970/06/21,  MRN: [id number]  Chief Complaint  Patient presents with   Tendonitis    still having pain in left heel, thinks its a bone spur    51 y.o. male presents with the above complaint. History confirmed with patient.  He is wearing the heel lifts and doing the exercises this is helped some.  He is also taking the meloxicam.  Today he notes the pain is moved more to the underside of the foot and is painful and inquires if an injection would be helpful.  Objective:  Physical Exam: warm, good capillary refill, no trophic changes or ulcerative lesions, normal DP and PT pulses, and normal sensory exam.  Pes planus deformity noted Left Foot: tenderness at Achilles tendon insertion and gastrocnemius equinus is noted with a positive silverskiold test, pain on palpation of the plantar heel at the insertion of the medial band of the plantar fascia Radiographs: Multiple views x-ray of the left foot: no fracture, dislocation, swelling or degenerative changes noted and posterior calcaneal spur Assessment:   1. Achilles tendinitis of left lower extremity   2. Plantar fasciitis of left foot   3. Pes planus of both feet       Plan:  Patient was evaluated and treated and all questions answered.  Discussed the etiology and treatment options for Achilles tendinitis including stretching, formal physical therapy with an eccentric exercises therapy plan, supportive shoegears such as a running shoe or sneaker, heel lifts, topical and oral medications.  We also discussed that I do not routinely perform injections in this area because of the risk of an increased damage or rupture of the tendon.  We also discussed the role of surgical treatment of this for patients who do not improve after exhausting non-surgical treatment options.  -Continue meloxicam  -Continue home physical therapy plan and heel lifts -Injection performed to the medial plantar  fascia at the insertion on the medial calcaneal tubercle. -He was casted for custom molded foot orthoses today to support the longitudinal arch fascia and the Achilles tendon is thinking benefit greatly from these.  He is wearing as before with relief.  After sterile prep with povidone-iodine solution and alcohol, the left heel was injected with 0.5cc 2% xylocaine plain, 0.5cc 0.5% marcaine plain, 5mg  triamcinolone acetonide, and 2mg  dexamethasone was injected along the medial plantar fascia at the insertion on the plantar calcaneus. The patient tolerated the procedure well without complication.    Return in about 4 weeks (around 02/07/2021) for re-check Achilles tendon, recheck plantar fasciitis.

## 2021-01-16 NOTE — Progress Notes (Signed)
SITUATION Reason for Consult: Evaluation for Bilateral Custom Foot Orthoses Patient / Caregiver Report: Patient has painful flat feet  OBJECTIVE DATA: Patient History / Diagnosis:    ICD-10-CM   1. Plantar fasciitis of left foot  M72.2     2. Pes planus of both feet  M21.41    M21.42       Current or Previous Devices: None and no history  Foot Examination: Skin presentation:   Intact Ulcers & Callousing:   None and no history Toe / Foot Deformities:  Pes planus Weight Bearing Presentation:  planus Sensation:    Intact  ORTHOTIC RECOMMENDATION Recommended Device: 1x pair of custom functional foot orthotics  GOALS OF ORTHOSES - Reduce Pain - Prevent Foot Deformity - Prevent Progression of Further Foot Deformity - Relieve Pressure - Improve the Overall Biomechanical Function of the Foot and Lower Extremity.  ACTIONS PERFORMED Patient was casted for Foot Orthoses via crush box. Procedure was explained and patient tolerated procedure well. All questions were answered and concerns addressed.  PLAN Potential out of pocket cost was communicated to patient. Casts are to be sent to [facility] for fabrication. Patient is to be called for fitting when devices are ready.

## 2021-01-19 ENCOUNTER — Telehealth: Payer: Self-pay | Admitting: Podiatry

## 2021-01-19 NOTE — Telephone Encounter (Signed)
Pt left message late  yesterday afternoon asking when his inserts would be in..  I returned call and left message that they generally take 3 to 4 weeks to come in and I have scheduled him to pick them up on 1.23 when he is also seeing Dr Blenda Mounts. The message stated if they were not in the Friday before I would call him and let him know.

## 2021-01-23 ENCOUNTER — Ambulatory Visit: Payer: Self-pay | Admitting: Podiatry

## 2021-01-27 ENCOUNTER — Telehealth: Payer: Self-pay | Admitting: Podiatry

## 2021-01-27 NOTE — Telephone Encounter (Signed)
Orthotics not in.. lvm for pt to keep appt with Dr Blenda Mounts for Monday 1.23.23 @ 315 but I was canceling the 400pm appt with Aaron Edelman and I would call to schedule an appt when they come in.

## 2021-01-30 ENCOUNTER — Encounter: Payer: Self-pay | Admitting: Podiatry

## 2021-01-30 ENCOUNTER — Other Ambulatory Visit: Payer: Self-pay

## 2021-01-30 ENCOUNTER — Ambulatory Visit (INDEPENDENT_AMBULATORY_CARE_PROVIDER_SITE_OTHER): Payer: BC Managed Care – PPO | Admitting: Podiatry

## 2021-01-30 DIAGNOSIS — M7662 Achilles tendinitis, left leg: Secondary | ICD-10-CM | POA: Diagnosis not present

## 2021-01-30 NOTE — Progress Notes (Signed)
°  Subjective:  Patient ID: Henry Young, male    DOB: Jun 03, 1970,   MRN: [id number]  Chief Complaint  Patient presents with   Plantar Fasciitis    Pt is here to f/u plantar fasciitis . Pt reports nothing is helping.     51 y.o. male presents for follow-up of achilles tendonitis. Patient relates he has continued to have pain and plantar fascial injection did not help his pain. Relates he has not been reliably stretching and the heel lifts wear out to quickly. Hoping for something else to be done today.  Denies any other pedal complaints. Denies n/v/f/c.   Past Medical History:  Diagnosis Date   Diabetes mellitus without complication ([place])    pre diabetes   Hyperlipidemia    Hypertension    Neuromuscular disorder ([place])    sciatica   Pre-diabetes     Objective:  Physical Exam: Vascular: DP/PT pulses 2/4 bilateral. CFT <3 seconds. Normal hair growth on digits. No edema.  Skin. No lacerations or abrasions bilateral feet.  Musculoskeletal: MMT 5/5 bilateral lower extremities in DF, PF, Inversion and Eversion. Deceased ROM in DF of ankle joint. Tender to achilles insertion and more proximally along the tendon. No pain along medial calcaneal tubercle. Pain with DF of the ankle No pain with PF.  Neurological: Sensation intact to light touch.   Assessment:   1. Achilles tendinitis of left lower extremity      Plan:  Patient was evaluated and treated and all questions answered. -Xrays reviewed -Discussed Achilles insertional tendonitis and treatment options with patient.  -Discussed stretching exercises. Reprinted instructions for him.  -Continue meloxicam  -Amb ref to PT placed.  -Heel lifts provided and discussed proper shoewear.  -Discussed if no improvement will consider MRI/PT/EPAT/PRP injections.  -Patient to return to office as needed or sooner if condition worsens.   Lorenda Peck, DPM

## 2021-01-30 NOTE — Patient Instructions (Signed)

## 2021-02-01 ENCOUNTER — Other Ambulatory Visit: Payer: Self-pay | Admitting: Podiatry

## 2021-02-01 DIAGNOSIS — M7662 Achilles tendinitis, left leg: Secondary | ICD-10-CM

## 2021-02-09 ENCOUNTER — Telehealth: Payer: Self-pay | Admitting: Podiatry

## 2021-02-09 NOTE — Telephone Encounter (Signed)
Pt left message checking status of orthotics.Marland Kitchen  Upon checking they are in and I called and left message for pt to call to schedule an appt to pick them up.

## 2021-02-14 ENCOUNTER — Other Ambulatory Visit: Payer: Self-pay

## 2021-02-14 ENCOUNTER — Ambulatory Visit: Payer: BC Managed Care – PPO

## 2021-02-14 DIAGNOSIS — M722 Plantar fascial fibromatosis: Secondary | ICD-10-CM

## 2021-02-14 DIAGNOSIS — M7662 Achilles tendinitis, left leg: Secondary | ICD-10-CM

## 2021-02-14 DIAGNOSIS — M2141 Flat foot [pes planus] (acquired), right foot: Secondary | ICD-10-CM

## 2021-02-14 NOTE — Progress Notes (Signed)
SITUATION: Reason for Visit: Fitting and Delivery of Custom Fabricated Foot Orthoses Patient Report: Patient reports comfort and is satisfied with device.  OBJECTIVE DATA: Patient History / Diagnosis:     ICD-10-CM   1. Achilles tendinitis of left lower extremity  M76.62     2. Plantar fasciitis of left foot  M72.2     3. Pes planus of both feet  M21.41    M21.42     4. Plantar fasciitis, right  M72.2       Provided Device:  Custom Functional Foot Orthotics     [facility] [employer]: [id number]  GOAL OF ORTHOSIS - Improve gait - Decrease energy expenditure - Improve Balance - Provide Triplanar stability of foot complex - Facilitate motion  ACTIONS PERFORMED Patient was fit with foot orthotics trimmed to shoe last. Patient tolerated fittign procedure.   Patient was provided with verbal and written instruction and demonstration regarding donning, doffing, wear, care, proper fit, function, purpose, cleaning, and use of the orthosis and in all related precautions and risks and benefits regarding the orthosis.  Patient was also provided with verbal instruction regarding how to report any failures or malfunctions of the orthosis and necessary follow up care. Patient was also instructed to contact our office regarding any change in status that may affect the function of the orthosis.  Patient demonstrated independence with proper donning, doffing, and fit and verbalized understanding of all instructions.  PLAN: Patient is to follow up in one week or as necessary (PRN). All questions were answered and concerns addressed. Plan of care was discussed with and agreed upon by the patient.

## 2021-02-20 ENCOUNTER — Emergency Department (HOSPITAL_COMMUNITY): Payer: BC Managed Care – PPO

## 2021-02-20 ENCOUNTER — Other Ambulatory Visit: Payer: Self-pay

## 2021-02-20 ENCOUNTER — Encounter (HOSPITAL_COMMUNITY): Payer: Self-pay

## 2021-02-20 ENCOUNTER — Emergency Department (HOSPITAL_COMMUNITY)
Admission: EM | Admit: 2021-02-20 | Discharge: 2021-02-20 | Disposition: A | Discharge status: Home / Self Care | Payer: BC Managed Care – PPO | Attending: Emergency Medicine | Admitting: Emergency Medicine

## 2021-02-20 DIAGNOSIS — W19XXXA Unspecified fall, initial encounter: Secondary | ICD-10-CM

## 2021-02-20 DIAGNOSIS — M25461 Effusion, right knee: Secondary | ICD-10-CM | POA: Diagnosis not present

## 2021-02-20 DIAGNOSIS — I10 Essential (primary) hypertension: Secondary | ICD-10-CM | POA: Insufficient documentation

## 2021-02-20 DIAGNOSIS — Z7984 Long term (current) use of oral hypoglycemic drugs: Secondary | ICD-10-CM | POA: Diagnosis not present

## 2021-02-20 DIAGNOSIS — W010XXA Fall on same level from slipping, tripping and stumbling without subsequent striking against object, initial encounter: Secondary | ICD-10-CM | POA: Diagnosis not present

## 2021-02-20 DIAGNOSIS — E119 Type 2 diabetes mellitus without complications: Secondary | ICD-10-CM | POA: Insufficient documentation

## 2021-02-20 DIAGNOSIS — M545 Low back pain, unspecified: Secondary | ICD-10-CM | POA: Diagnosis not present

## 2021-02-20 DIAGNOSIS — M25561 Pain in right knee: Secondary | ICD-10-CM

## 2021-02-20 NOTE — ED Triage Notes (Signed)
Patient reports that he was on a wooden walkway this AM and slipped. Patient c/o right knee pain and swelling. Patient c/o low back pain. Patient denies hitting his head or having LOC.

## 2021-02-20 NOTE — ED Provider Notes (Signed)
[facility] DEPT Provider Note   CSN: [id number] Arrival date & time: 02/20/21  1338     History  Chief Complaint  Patient presents with   Fall    Henry Young is a 51 y.o. male.  Henry Young is a 51 y.o. male with hx of HTN, HLD, DM, who presents to the emergency department for evaluation after a mechanical fall.  Patient reports he was leaving for work and slipped on a wooden walkway landing on his right knee.  He reports since then he has had worsening pain and some swelling in the right knee.  He also reports some mild low back pain with history of prior surgery.  Denies any numbness tingling or weakness in his extremities.  He has been ambulatory since the fall.  He denies hitting his head, no neck pain, no chest pain or abdominal pain.  Patient not on any blood thinners.  The history is provided by the patient.      Home Medications Prior to Admission medications   Medication Sig Start Date End Date Taking? Authorizing Provider  atorvastatin (LIPITOR) 40 MG tablet TAKE 1 TABLET BY MOUTH EVERY DAY Patient taking differently: Take 40 mg by mouth daily. 10/13/19   Vivi Barrack, MD  empagliflozin (JARDIANCE) 10 MG TABS tablet TAKE 10 MG BY MOUTH DAILY BEFORE BREAKFAST. Patient taking differently: Take 10 mg by mouth daily. TAKE 10 MG BY MOUTH DAILY BEFORE BREAKFAST. 03/28/20   Vivi Barrack, MD  hydrochlorothiazide (HYDRODIURIL) 25 MG tablet TAKE 1 TABLET (25 MG TOTAL) BY MOUTH DAILY. 05/23/20   Vivi Barrack, MD  losartan-hydrochlorothiazide (HYZAAR) 100-25 MG tablet TAKE 1 TABLET BY MOUTH EVERY DAY Patient taking differently: Take 1 tablet by mouth daily. 02/15/20   Vivi Barrack, MD  meloxicam (MOBIC) 15 MG tablet Take 1 tablet (15 mg total) by mouth daily. 12/30/20   McDonald, Stephan Minister, DPM  methocarbamol (ROBAXIN) 500 MG tablet Take 1 tablet (500 mg total) by mouth every 6 (six) hours as needed for muscle spasms. 04/22/20   Phylliss Bob, MD  methylPREDNISolone (MEDROL DOSEPAK) 4 MG TBPK tablet 6 day dose pack - take as directed 12/30/20   Criselda Peaches, DPM  oxyCODONE-acetaminophen (PERCOCET) 5-325 MG tablet Take 1-2 tablets by mouth every 4 (four) hours as needed for severe pain. 04/22/20 04/22/21  Phylliss Bob, MD  traZODone (DESYREL) 50 MG tablet TAKE 0.5-1 TABLETS BY MOUTH AT BEDTIME AS NEEDED FOR SLEEP. Patient not taking: Reported on 04/07/2020 03/28/20   Vivi Barrack, MD      Allergies    Patient has no known allergies.    Review of Systems   Review of Systems  Constitutional:  Negative for chills and fever.  Musculoskeletal:  Positive for arthralgias, back pain and joint swelling. Negative for neck pain.  Neurological:  Negative for weakness, numbness and headaches.  All other systems reviewed and are negative.  Physical Exam Updated Vital Signs BP (!) 150/98 (BP Location: Right Arm)    Pulse 87    Temp 98.1 F (36.7 C) (Oral)    Resp 16    Ht 6\' 1"  (1.854 m)    Wt 108 kg    SpO2 99%    BMI 31.40 kg/m  Physical Exam Vitals and nursing note reviewed.  Constitutional:      General: He is not in acute distress.    Appearance: Normal appearance. He is well-developed. He is not ill-appearing or diaphoretic.  HENT:     Head: Normocephalic and atraumatic.     Comments: No hematoma, step-off or signs of head trauma Eyes:     General:        Right eye: No discharge.        Left eye: No discharge.  Neck:     Comments: No midline C-spine tenderness Cardiovascular:     Rate and Rhythm: Normal rate.  Pulmonary:     Effort: Pulmonary effort is normal. No respiratory distress.  Chest:     Chest wall: No tenderness.  Abdominal:     Tenderness: There is no abdominal tenderness.  Musculoskeletal:     Comments: There is some very mild lumbar paraspinal tenderness, no focal midline tenderness, step-off or deformity, no midline thoracic tenderness. Tenderness over the right knee, primarily over the patella  with mild swelling, patient able to fully flex and extend the knee with some discomfort.  Distal pulses 2+, normal sensation and strength. All of the joints supple and easily movable, all compartments soft.  Skin:    General: Skin is warm and dry.  Neurological:     Mental Status: He is alert and oriented to person, place, and time.     Coordination: Coordination normal.  Psychiatric:        Mood and Affect: Mood normal.        Behavior: Behavior normal.    ED Results / Procedures / Treatments   Labs (all labs ordered are listed, but only abnormal results are displayed) Labs Reviewed - No data to display  EKG None  Radiology DG Lumbar Spine Complete  Result Date: 02/20/2021 CLINICAL DATA:  Fall, low back pain EXAM: LUMBAR SPINE - COMPLETE 4+ VIEW COMPARISON:  04/21/2020 lumbar spine radiographs FINDINGS: This report assumes 5 non rib-bearing lumbar vertebrae. Status post bilateral posterior spinal fusion L4-5 with no evidence of hardware fracture or loosening. Bone cage in place and L4-5 disc space. Lumbar vertebral body heights are preserved, with no fracture. 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 Minimal 3 mm retrolisthesis at L3-4, unchanged. No aggressive appearing focal osseous lesions. IMPRESSION: 1. No lumbar spine fracture or acute malalignment. 2. Status post bilateral posterior spinal fusion L4-5, with no evidence of hardware complication. Electronically Signed   By: Ilona Sorrel M.D.   On: 02/20/2021 15:04   DG Knee Complete 4 Views Right  Result Date: 02/20/2021 CLINICAL DATA:  Pain after a fall. EXAM: RIGHT KNEE - COMPLETE 4+ VIEW COMPARISON:  12/13/2006 FINDINGS: No acute fracture or dislocation. Possible small suprapatellar joint effusion. Healed fibrous cortical defect involving the posteromedial proximal tibia. Increased density within the distal quadriceps may represent minimal heterotopic ossification. IMPRESSION: No acute osseous abnormality. Possible  small suprapatellar joint effusion. Electronically Signed   By: Abigail Miyamoto M.D.   On: 02/20/2021 15:03    Procedures Procedures    Medications Ordered in ED Medications - No data to display  ED Course/ Medical Decision Making/ A&P                           51 year old male presents after a mechanical fall complaining of right knee pain and low back pain, this involves an extensive number of treatment options, and is a complaint that carries with it a high risk of complications and morbidity.  The differential diagnosis includes fracture, soft tissue injury, ligamentous or tendon injury, meniscal tear.    On arrival pt is nontoxic, vitals significant for  mild hypertension but otherwise unremarkable. Exam significant for anterior knee tenderness with mild swelling, but neurovascularly intact, some lumbar paraspinal tenderness, no step-off or deformity.  Additional history obtained from chart review.  Outside records obtained and reviewed including recent podiatry and internal medicine visits.  Consider blood work but given that patient's fall was mechanical and appears to be minor without significant injury do not feel this would change management at this time  Imaging Studies ordered:  I ordered imaging studies which included plain films of the right knee and lumbar spine, I independently visualized and interpreted imaging which showed no acute fracture or bony deformity.  Mild effusion on the right knee, some chronic degenerative changes in the lumbar spine, s/p spinal fusion  ED Course:   I discussed reassuring imaging with patient.  Suspect soft tissue injury from fall versus internal derangement of the knee and mild musculoskeletal back pain.  Encouraged treatment with Motrin and Tylenol, patient provided knee sleeve and instructed on icing and elevating the knee.  Orthopedic follow-up if symptoms not improving.  Discharged home in good condition.    Portions of this note were  generated with [employer]. Dictation errors may occur despite best attempts at proofreading.         Final Clinical Impression(s) / ED Diagnoses Final diagnoses:  Fall, initial encounter  Acute pain of right knee    Rx / DC Orders ED Discharge Orders     None         Janet Berlin 02/20/21 1558    Regan Lemming, MD 02/20/21 1623

## 2021-02-20 NOTE — ED Provider Triage Note (Signed)
Emergency Medicine Provider Triage Evaluation Note  Henry Young , a 51 y.o. male  was evaluated in triage.  Pt complains of fall.  Patient reports this morning when he was leaving for work he slipped and fell on a wooden walkway, landing on his right knee.  Reports since then he has had worsening pain and some swelling in the right knee.  He also reports some mild pain in his low back.  Reports history of prior back surgery.  No head or injury.  Denies neck pain, no numbness or weakness.  No chest or abdominal pain. Review of Systems  Positive: Knee pain, back pain Negative: Head injury, neck pain, chest pain, abdominal pain  Physical Exam  BP (!) 150/98 (BP Location: Right Arm)    Pulse 87    Temp 98.1 F (36.7 C) (Oral)    Resp 16    Ht 6\' 1"  (1.854 m)    Wt 108 kg    SpO2 99%    BMI 31.40 kg/m  Gen:   Awake, no distress   Resp:  Normal effort  MSK:   Moves extremities without difficulty, tenderness over the anterior right knee with slight swelling, no significant deformity, mild low back tenderness without palpable step-off or deformity Other:    Medical Decision Making  Medically screening exam initiated at 1:54 PM.  Appropriate orders placed.  Shontez D Ancrum was informed that the remainder of the evaluation will be completed by another provider, this initial triage assessment does not replace that evaluation, and the importance of remaining in the ED until their evaluation is complete.  X-rays ordered to evaluate pain after fall   Jacqlyn Larsen, [place] 02/20/21 1401

## 2021-02-20 NOTE — Progress Notes (Signed)
Orthopedic Tech Progress Note Patient Details:  Henry Young [birth year] [id number]  Ortho Devices Type of Ortho Device: Knee Sleeve Ortho Device/Splint Location: right Ortho Device/Splint Interventions: Application   Post Interventions Patient Tolerated: Well Instructions Provided: Care of device  [name] 02/20/2021, 3:53 PM

## 2021-02-20 NOTE — Discharge Instructions (Addendum)
Your x-rays today are reassuring, no evidence of fracture.  Use knee sleeve, ice and elevate the knee and use Motrin and Tylenol to help with pain.  If symptoms are not improving please follow-up with orthopedics.

## 2021-03-22 NOTE — Therapy (Signed)
?OUTPATIENT PHYSICAL THERAPY LOWER EXTREMITY EVALUATION ? ? ?Patient Name: Henry Young ?MRN: [id number] ?DOB:1970/08/13, 51 y.o., male ?Today's Date: 03/23/2021 ? ? PT End of Session - 03/23/21 1019   ? ? Visit Number 1   ? Number of Visits 12   ? Date for PT Re-Evaluation 05/04/21   ? PT Start Time 1015   ? PT Stop Time 1100   ? PT Time Calculation (min) 45 min   ? Activity Tolerance Patient tolerated treatment well   ? Behavior During Therapy [facility] for tasks assessed/performed   ? ?  ?  ? ?  ? ? ?Past Medical History:  ?Diagnosis Date  ? Diabetes mellitus without complication ([place])   ? pre diabetes  ? Hyperlipidemia   ? Hypertension   ? Neuromuscular disorder ([place])   ? sciatica  ? Pre-diabetes   ? ?Past Surgical History:  ?Procedure Laterality Date  ? TRANSFORAMINAL LUMBAR INTERBODY FUSION (TLIF) WITH PEDICLE SCREW FIXATION 1 LEVEL Left 04/21/2020  ? Procedure: LEFT-SIDED LUMBAR 4 - LUMBAR 5 TRANSFORAMINAL LUMBAR INTERBODY FUSION WITH INSTRUMENTATION AND ALLOGRAFT;  Surgeon: Phylliss Bob, MD;  Location: [facility];  Service: Orthopedics;  Laterality: Left;  ? WISDOM TOOTH EXTRACTION    ? ?Patient Active Problem List  ? Diagnosis Date Noted  ? Radiculopathy 04/21/2020  ? Insomnia 01/05/2020  ? Nonintractable headache 02/02/2019  ? 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 Chronic left shoulder pain 03/24/2018  ? Dyslipidemia 02/11/2018  ? Prediabetes 02/11/2018  ? Essential hypertension 02/06/2018  ? ? ?PCP: Erby Pian, PA-C ? ?REFERRING PROVIDER: Lorenda Peck, MD ? ?REFERRING DIAG: M76.62 (ICD-10-CM) - Achilles tendinitis of left lower extremity  ? ?THERAPY DIAG:  ?Achilles tendinitis of left lower extremity ? ?ONSET DATE: 3-4 mos ? ?SUBJECTIVE:  ? ?SUBJECTIVE STATEMENT: ?Patient noticed this pain in L foot/ankle about 4 mos ago.  They said they couldn't give me a shot back there. He is now feeling it in his Rt one to (2 weeks). He received custom orthotics and that helped a little bit.  I do a little stretch in  the AM but really brief.  He has so much pain after work he does not do it.  ? ?PERTINENT HISTORY: ?Pre-Diabetic, HTN, plantar fasciitis Rt foot  ?Lumbar surgery April 2021 ? ?PAIN:  ?Are you having pain? Yes: NPRS scale: 8/10 ?Pain location: L heel  ?Pain description: sharp  ?Aggravating factors: being on my feet  ?Relieving factors: sitting 2/10-3/10 ? ?PRECAUTIONS: Other: none  ? ?WEIGHT BEARING RESTRICTIONS No ? ?FALLS:  ?Has patient fallen in last 6 months? Yes, Number of falls: 1, slipped on ice  ? ?LIVING ENVIRONMENT: ?Lives with: lives alone ?Lives in: House/apartment ?Stairs: No; Internal: none steps; none ?Has following equipment at home: None ? ?OCCUPATION: Counsellor, Furniture conservator/restorer ? 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 ?PATIENT GOALS I want to be rid of this pain ? ? ?OBJECTIVE:  ? ?DIAGNOSTIC FINDINGS: recent not available from [facility]   ? ?PATIENT SURVEYS:  ?LEFS NT ?FOTO 52% ? ?COGNITION: ? Overall cognitive status: Within functional limits for tasks assessed   ?  ?SENSATION: ?WFL ? ?POSTURE:  ?Good arches , maintained with squat ?Wide stance, hips ER  ? ?PALPATION: ?Tender, painful at calcaneus L ankle , min tenderness along Achilles and into calf ? ?LE ROM: ? ?Active ROM Right ?03/23/2021 Left ?03/23/2021  ?Hip flexion    ?  Hip extension    ?Hip abduction    ?Hip adduction    ?Hip internal rotation    ?Hip external rotation    ?Knee flexion    ?Knee extension    ?Ankle dorsiflexion 15 10  ?Ankle plantarflexion 40 40  ?Ankle inversion 40 30  ?Ankle eversion 15 10  ? (Blank rows = not tested) ? ?LE MMT: ? ?MMT Right ?03/23/2021 Left ?03/23/2021  ?Hip flexion    ?Hip extension    ?Hip abduction    ?Hip adduction    ?Hip internal rotation    ?Hip external rotation    ?Knee flexion 5/5 5/5  ?Knee extension 5/5 4+/5  ?Ankle dorsiflexion 5/5 5/5  ?Ankle plantarflexion  4/5 4/5  ?Ankle inversion  5/5  ?Ankle eversion  5/5  ? (Blank rows = not tested) ? ?FUNCTIONAL TESTS:  ?SLS  about 20 sec bilateral, L ankle more effort needed to maintain  ?Poor clearance with single leg heel raise ?Squat WFL with decent arch maintained  ? ?GAIT: ?Distance walked: 150 ?Assistive device utilized: None ?Level of assistance: Complete Independence ?Comments: NA ? ? ? ?TODAY'S TREATMENT: ?PT eval, HEP demo and performed, POC, options to treatment and prognosis, anatomy of Achilles and foot, relationship to plantar fasciitis.  ? ? ?PATIENT EDUCATION:  ?Education details: see above ?Person educated: Patient ?Education method: Explanation, Demonstration, and Handouts ?Education comprehension: verbalized understanding and needs further education ? ? ?HOME EXERCISE PROGRAM: ?Access Code: [email or web address] ?URL: [email or web address] ?Date: 03/23/2021 ?Prepared by: Raeford Razor ? ?Exercises ?Supine Hamstring Stretch with Strap - 1 x daily - 7 x weekly - 1 sets - 5 reps - 30 hold ?Standing Gastroc Stretch on Step - 1 x daily - 7 x weekly - 2 sets - 10 reps - 30 hold ?Standing Gastroc Stretch - 2 x daily - 7 x weekly - 1 sets - 5 reps - 30 hold ?Standing Heel Raises - 1 x daily - 7 x weekly - 3 sets - 10 reps - 3 hold ?Standing Single Leg Heel Raise - 1 x daily - 7 x weekly - 3 sets - 10 reps - 3 hold ? ? ?ASSESSMENT: ? ?CLINICAL IMPRESSION: ?Patient is a 51 y.o. male who was seen today for physical therapy evaluation and treatment for L ankle/achilles tendinitis. He did have a history of LLE involvement with lumbar pathology, now improved with fusion.  He should expect a good result with skilled PT intervention.  ? ? ?OBJECTIVE IMPAIRMENTS decreased balance, decreased mobility, difficulty walking, decreased strength, increased fascial restrictions, impaired flexibility, and pain.  ? ?ACTIVITY LIMITATIONS community activity and occupation.  ? ?PERSONAL FACTORS Profession and 3+ comorbidities: diabetes,  lumbar fusion, h/o plantar fasciitis  are also affecting patient's functional outcome.  ? ? ?REHAB POTENTIAL: Excellent ? ?CLINICAL DECISION MAKING: Stable/uncomplicated ? ?EVALUATION COMPLEXITY: Low ? ? ?GOALS: ?Goals reviewed with patient? Yes ? ? ?LONG TERM GOALS: Target date: 05/04/2021 ? ?Pt will be able to report pain in AM hours and when not working < 2/10 ?Baseline: can be moderate with days off, severe days on ?Goal status: INITIAL ? ?2.  Pt will be able to work full day with pain no more than 4/10 end of the day  ?Baseline: 8/10-10/10 ?Goal status: INITIAL ? ?3.  Pt will be able to stand on LLE and perform 15 calf raises  with good clearance ?Baseline: needs UE assist  , min pain with < 8 reps  ?Goal status: INITIAL ? ?4.  Pt will be I  with HEP for LE stretching and strength  ?Baseline: given HEP  ?Goal status: INITIAL ? ?5.  Pt will be able to improve FOTO score to 63% or better  ?Baseline: 52% ?Goal status: INITIAL ? ?PLAN: ?PT FREQUENCY: 1-2x/week ? ?PT DURATION: 6 weeks ? ?PLANNED INTERVENTIONS: Therapeutic exercises, Therapeutic activity, Neuromuscular re-education, Balance training, Gait training, Patient/Family education, Joint mobilization, Dry Needling, Cryotherapy, Moist heat, Taping, Ultrasound, Ionotophoresis '4mg'$ /ml Dexamethasone, and Manual therapy ? ?PLAN FOR NEXT SESSION: check HEP , eccentric achilles. SLS , try iontopatch? Tape  ? ? ?Eunie Lawn, PT ?03/23/2021, 1:42 PM  ?

## 2021-03-23 ENCOUNTER — Other Ambulatory Visit: Payer: Self-pay

## 2021-03-23 ENCOUNTER — Ambulatory Visit: Payer: BC Managed Care – PPO | Attending: Podiatry | Admitting: Physical Therapy

## 2021-03-23 DIAGNOSIS — M7662 Achilles tendinitis, left leg: Secondary | ICD-10-CM | POA: Diagnosis not present

## 2021-04-04 NOTE — Therapy (Signed)
?OUTPATIENT PHYSICAL THERAPY TREATMENT NOTE ? ? ?Patient Name: Henry Young ?MRN: [id number] ?DOB:1970/05/08, 51 y.o., male ?Today's Date: 04/05/2021 ? ?PCP: Erby Pian, PA-C ?REFERRING PROVIDER: Erby Pian,* ? ? PT End of Session - 04/05/21 1328   ? ? Visit Number 2   ? Number of Visits 12   ? Date for PT Re-Evaluation 05/04/21   ? PT Start Time 1325   ? PT Stop Time 1414   ? PT Time Calculation (min) 49 min   ? Activity Tolerance Patient tolerated treatment well   ? Behavior During Therapy [facility] for tasks assessed/performed   ? ?  ?  ? ?  ? ? ?Past Medical History:  ?Diagnosis Date  ? Diabetes mellitus without complication ([place])   ? pre diabetes  ? Hyperlipidemia   ? Hypertension   ? Neuromuscular disorder ([place])   ? sciatica  ? Pre-diabetes   ? ?Past Surgical History:  ?Procedure Laterality Date  ? TRANSFORAMINAL LUMBAR INTERBODY FUSION (TLIF) WITH PEDICLE SCREW FIXATION 1 LEVEL Left 04/21/2020  ? Procedure: LEFT-SIDED LUMBAR 4 - LUMBAR 5 TRANSFORAMINAL LUMBAR INTERBODY FUSION WITH INSTRUMENTATION AND ALLOGRAFT;  Surgeon: Phylliss Bob, MD;  Location: [place];  Service: Orthopedics;  Laterality: Left;  ? WISDOM TOOTH EXTRACTION    ? ?Patient Active Problem List  ? Diagnosis Date Noted  ? Radiculopathy 04/21/2020  ? Insomnia 01/05/2020  ? Nonintractable headache 02/02/2019  ? 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 Chronic left shoulder pain 03/24/2018  ? Dyslipidemia 02/11/2018  ? Prediabetes 02/11/2018  ? Essential hypertension 02/06/2018  ? ? ?REFERRING DIAG: M76.62 (ICD-10-CM) - Achilles tendinitis of left lower extremity  ? ?THERAPY DIAG:  ?Achilles tendinitis of left lower extremity ? ?PERTINENT HISTORY: lumbar pain  ? ?PRECAUTIONS: none  ? ?SUBJECTIVE: I have not really done much exercises.  I tried to pay golf and that wasn't so good (back) .  I am lazy and that's the last thing I wanna do. Bought me a step.  ? ?PAIN:  ?Are you having pain? Yes: NPRS scale: 3/10 ?Pain location: L achilles  ?Pain  description: tight  ?Aggravating factors: walking, standing  ?Relieving factors: old shoes made it feel better. Stretches.  ? ? ?DIAGNOSTIC FINDINGS: recent not available from [facility]   ?  ?PATIENT SURVEYS:  ?LEFS NT ?FOTO 52% ?  ?COGNITION: ?          Overall cognitive status: Within functional limits for tasks assessed               ?           ?SENSATION: ?WFL ?  ?POSTURE:  ?Good arches , maintained with squat ?Wide stance, hips ER  ?  ?PALPATION: ?Tender, painful at calcaneus L ankle , min tenderness along Achilles and into calf ?  ?LE ROM: ?  ?Active ROM Right ?03/23/2021 Left ?03/23/2021  ?Hip flexion      ?Hip extension      ?Hip abduction      ?Hip adduction      ?Hip internal rotation      ?Hip external rotation      ?Knee flexion      ?Knee extension      ?Ankle dorsiflexion 15 10  ?Ankle plantarflexion 40 40  ?Ankle inversion 40 30  ?Ankle eversion 15 10  ? (Blank rows = not tested) ?  ?LE MMT: ?  ?MMT Right ?03/23/2021 Left ?03/23/2021  ?Hip flexion      ?Hip extension      ?  Hip abduction      ?Hip adduction      ?Hip internal rotation      ?Hip external rotation      ?Knee flexion 5/5 5/5  ?Knee extension 5/5 4+/5  ?Ankle dorsiflexion 5/5 5/5  ?Ankle plantarflexion 4/5 4/5  ?Ankle inversion   5/5  ?Ankle eversion   5/5  ? (Blank rows = not tested) ?  ?FUNCTIONAL TESTS:  ?SLS  about 20 sec bilateral, L ankle more effort needed to maintain  ?Poor clearance with single leg heel raise ?Squat WFL with decent arch maintained  ?  ?GAIT: ?Distance walked: 150 ?Assistive device utilized: None ?Level of assistance: Complete Independence ?Comments: NA ?  ?  ?  ?TODAY'S TREATMENT: ?PT eval, HEP demo and performed, POC, options to treatment and prognosis, anatomy of Achilles and foot, relationship to plantar fasciitis.  ?  ? ?[facility] Adult PT Treatment:                                                DATE: 04/05/21 ?Therapeutic Exercise: ?Recumbent bike 5 min L 3 ?Slantboard 60 sec x 3  ?Standing SLS 30 sec  ?Single leg  heel raise x 15  ?Narrow stance , toe in, toes out x 15 each  ?Hamstring LLE x 30 x 3 ?Ankle circles with black band limited coordination  ?Eccentric heel raise off step x 15  ?Dead lift x 10 2 sets ( 15 kb, 25 kb)  ? ?Manual Therapy: ?IASTM L achilles , gastrocnemius and heel, manual soft tissue mobilization.  ? ?Modalities: ?Ionto patch 6 hour patch 1 cc to L achilles with tape to secure it  ? ?PATIENT EDUCATION:  ?Education details: see above. Back ex, ionto, HEP  ?Person educated: Patient ?Education method: Explanation, Demonstration, and Handouts ?Education comprehension: verbalized understanding and needs further education ?  ?  ?HOME EXERCISE PROGRAM: ?Access Code: [email or web address] ?URL: [email or web address] ?Date: 03/23/2021 ?Prepared by: Raeford Razor ?  ? Exercises ?Supine Hamstring Stretch with Strap - 1 x daily - 7 x weekly - 1 sets - 5 reps - 30 hold ?Standing Gastroc Stretch on Step - 1 x daily - 7 x weekly - 2 sets - 10 reps - 30 hold ?Standing Gastroc Stretch - 2 x daily - 7 x weekly - 1 sets - 5 reps - 30 hold ?Standing Heel Raises - 1 x daily - 7 x weekly - 3 sets - 10 reps - 3 hold ?Standing Single Leg Heel Raise - 1 x daily - 7 x weekly - 3 sets - 10 reps - 3 hold ?  ?  ?ASSESSMENT: ?  ?CLINICAL IMPRESSION: ?  ?Patient worked on LE strength today and SLS, intro to dead lifting and tissue loading .  Encouraged daily stretching.  Will remove patch after about 4 hours.  ?  ?OBJECTIVE IMPAIRMENTS decreased balance, decreased mobility, difficulty walking, decreased strength, increased fascial restrictions, impaired flexibility, and pain.  ?  ?ACTIVITY LIMITATIONS community activity and occupation.  ?  ?PERSONAL FACTORS Profession and 3+ comorbidities: diabetes, lumbar fusion, h/o plantar fasciitis  are also affecting patient's functional outcome.  ?  ?  ?REHAB POTENTIAL: Excellent ?  ?CLINICAL DECISION MAKING: Stable/uncomplicated ?  ?EVALUATION COMPLEXITY: Low ?  ?  ?GOALS: ?Goals reviewed with  patient? Yes ?  ?  ?LONG TERM GOALS: Target date: 05/04/2021 ?  ?Pt  will be able to report pain in AM hours and when not working < 2/10 ?Baseline: can be moderate with days off, severe days on ?Goal status: INITIAL ?  ?2.  Pt will be able to work full day with pain no more than 4/10 end of the day  ?Baseline: 8/10-10/10 ?Goal status: INITIAL ?  ?3.  Pt will be able to stand on LLE and perform 15 calf raises  with good clearance ?Baseline: needs UE assist  , min pain with < 8 reps  ?Goal status: INITIAL ?  ?4.  Pt will be I with HEP for LE stretching and strength  ?Baseline: given HEP  ?Goal status: INITIAL ?  ?5.  Pt will be able to improve FOTO score to 63% or better  ?Baseline: 52% ?Goal status: INITIAL ?  ?PLAN: ?PT FREQUENCY: 1-2x/week ?  ?PT DURATION: 6 weeks ?  ?PLANNED INTERVENTIONS: Therapeutic exercises, Therapeutic activity, Neuromuscular re-education, Balance training, Gait training, Patient/Family education, Joint mobilization, Dry Needling, Cryotherapy, Moist heat, Taping, Ultrasound, Ionotophoresis '4mg'$ /ml Dexamethasone, and Manual therapy ?  ?PLAN FOR NEXT SESSION: check HEP , eccentric achilles. SLS , try iontopatch? Tape .  How was ionto ? Massage session Wed. PM ?  ? ? ?Calel Pisarski, PT ?04/05/2021, 6:06 PM ? ?  Raeford Razor, PT ?04/05/21 6:06 PM ?Phone: [phone] ?Fax: [phone]  ?

## 2021-04-05 ENCOUNTER — Ambulatory Visit: Payer: BC Managed Care – PPO | Admitting: Physical Therapy

## 2021-04-05 ENCOUNTER — Encounter: Payer: Self-pay | Admitting: Physical Therapy

## 2021-04-05 DIAGNOSIS — M7662 Achilles tendinitis, left leg: Secondary | ICD-10-CM | POA: Diagnosis not present

## 2021-04-21 NOTE — Therapy (Deleted)
?OUTPATIENT PHYSICAL THERAPY TREATMENT NOTE ? ? ?Patient Name: Henry Young ?MRN: [id number] ?DOB:11/07/1970, 51 y.o., male ?Today's Date: 04/21/2021 ? ?PCP: Erby Pian, PA-C ?REFERRING PROVIDER: Erby Pian,* ? ? ? ? ?Past Medical History:  ?Diagnosis Date  ? Diabetes mellitus without complication ([place])   ? pre diabetes  ? Hyperlipidemia   ? Hypertension   ? Neuromuscular disorder ([place])   ? sciatica  ? Pre-diabetes   ? ?Past Surgical History:  ?Procedure Laterality Date  ? TRANSFORAMINAL LUMBAR INTERBODY FUSION (TLIF) WITH PEDICLE SCREW FIXATION 1 LEVEL Left 04/21/2020  ? Procedure: LEFT-SIDED LUMBAR 4 - LUMBAR 5 TRANSFORAMINAL LUMBAR INTERBODY FUSION WITH INSTRUMENTATION AND ALLOGRAFT;  Surgeon: Phylliss Bob, MD;  Location: [place];  Service: Orthopedics;  Laterality: Left;  ? WISDOM TOOTH EXTRACTION    ? ?Patient Active Problem List  ? Diagnosis Date Noted  ? Radiculopathy 04/21/2020  ? Insomnia 01/05/2020  ? Nonintractable headache 02/02/2019  ? 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 Chronic left shoulder pain 03/24/2018  ? Dyslipidemia 02/11/2018  ? Prediabetes 02/11/2018  ? Essential hypertension 02/06/2018  ? ? ?REFERRING DIAG: M76.62 (ICD-10-CM) - Achilles tendinitis of left lower extremity  ? ?THERAPY DIAG:  ?No diagnosis found. ? ?PERTINENT HISTORY: lumbar pain  ? ?PRECAUTIONS: none  ? ?SUBJECTIVE: *** ? ?I have not really done much exercises.  I tried to pay golf and that wasn't so good (back) .  I am lazy and that's the last thing I wanna do. Bought me a step.  ? ?PAIN:  ?Are you having pain? Yes: NPRS scale: 3/10 ?Pain location: L achilles  ?Pain description: tight  ?Aggravating factors: walking, standing  ?Relieving factors: old shoes made it feel better. Stretches.  ? ? ?DIAGNOSTIC FINDINGS: recent not available from [facility]   ?  ?PATIENT SURVEYS:  ?LEFS NT ?FOTO 52% ?  ?COGNITION: ?          Overall cognitive status: Within functional limits for tasks assessed               ?            ?SENSATION: ?WFL ?  ?POSTURE:  ?Good arches , maintained with squat ?Wide stance, hips ER  ?  ?PALPATION: ?Tender, painful at calcaneus L ankle , min tenderness along Achilles and into calf ?  ?LE ROM: ?  ?Active ROM Right ?03/23/2021 Left ?03/23/2021  ?Hip flexion      ?Hip extension      ?Hip abduction      ?Hip adduction      ?Hip internal rotation      ?Hip external rotation      ?Knee flexion      ?Knee extension      ?Ankle dorsiflexion 15 10  ?Ankle plantarflexion 40 40  ?Ankle inversion 40 30  ?Ankle eversion 15 10  ? (Blank rows = not tested) ?  ?LE MMT: ?  ?MMT Right ?03/23/2021 Left ?03/23/2021  ?Hip flexion      ?Hip extension      ?Hip abduction      ?Hip adduction      ?Hip internal rotation      ?Hip external rotation      ?Knee flexion 5/5 5/5  ?Knee extension 5/5 4+/5  ?Ankle dorsiflexion 5/5 5/5  ?Ankle plantarflexion 4/5 4/5  ?Ankle inversion   5/5  ?Ankle eversion   5/5  ? (Blank rows = not tested) ?  ?FUNCTIONAL TESTS:  ?SLS  about 20 sec bilateral,  L ankle more effort needed to maintain  ?Poor clearance with single leg heel raise ?Squat WFL with decent arch maintained  ?  ?GAIT: ?Distance walked: 150 ?Assistive device utilized: None ?Level of assistance: Complete Independence ?Comments: NA ?  ?  ?  ?TODAY'S TREATMENT: ?PT eval, HEP demo and performed, POC, options to treatment and prognosis, anatomy of Achilles and foot, relationship to plantar fasciitis.  ?  ? [facility] Adult PT Treatment:                                                DATE: 04/24/21 ?Therapeutic Exercise: ?*** ?Manual Therapy: ?*** ?Neuromuscular re-ed: ?*** ?Therapeutic Activity: ?*** ?Modalities: ?*** ?Self Care: ?***  ?[facility] Adult PT Treatment:                                                DATE: 04/05/21 ?Therapeutic Exercise: ?Recumbent bike 5 min L 3 ?Slantboard 60 sec x 3  ?Standing SLS 30 sec  ?Single leg heel raise x 15  ?Narrow stance , toe in, toes out x 15 each  ?Hamstring LLE x 30 x 3 ?Ankle circles with black band  limited coordination  ?Eccentric heel raise off step x 15  ?Dead lift x 10 2 sets ( 15 kb, 25 kb)  ? ?Manual Therapy: ?IASTM L achilles , gastrocnemius and heel, manual soft tissue mobilization.  ? ?Modalities: ?Ionto patch 6 hour patch 1 cc to L achilles with tape to secure it  ? ?PATIENT EDUCATION:  ?Education details: see above. Back ex, ionto, HEP  ?Person educated: Patient ?Education method: Explanation, Demonstration, and Handouts ?Education comprehension: verbalized understanding and needs further education ?  ?  ?HOME EXERCISE PROGRAM: ?Access Code: [email or web address] ?URL: [email or web address] ?Date: 03/23/2021 ?Prepared by: Raeford Razor ?  ? Exercises ?Supine Hamstring Stretch with Strap - 1 x daily - 7 x weekly - 1 sets - 5 reps - 30 hold ?Standing Gastroc Stretch on Step - 1 x daily - 7 x weekly - 2 sets - 10 reps - 30 hold ?Standing Gastroc Stretch - 2 x daily - 7 x weekly - 1 sets - 5 reps - 30 hold ?Standing Heel Raises - 1 x daily - 7 x weekly - 3 sets - 10 reps - 3 hold ?Standing Single Leg Heel Raise - 1 x daily - 7 x weekly - 3 sets - 10 reps - 3 hold ?  ?  ?ASSESSMENT: ?  ?CLINICAL IMPRESSION: ?  ?Patient worked on LE strength today and SLS, intro to dead lifting and tissue loading .  Encouraged daily stretching.  Will remove patch after about 4 hours.  ?  ?OBJECTIVE IMPAIRMENTS decreased balance, decreased mobility, difficulty walking, decreased strength, increased fascial restrictions, impaired flexibility, and pain.  ?  ?ACTIVITY LIMITATIONS community activity and occupation.  ?  ?PERSONAL FACTORS Profession and 3+ comorbidities: diabetes, lumbar fusion, h/o plantar fasciitis  are also affecting patient's functional outcome.  ?  ?  ?REHAB POTENTIAL: Excellent ?  ?CLINICAL DECISION MAKING: Stable/uncomplicated ?  ?EVALUATION COMPLEXITY: Low ?  ?  ?GOALS: ?Goals reviewed with patient? Yes ?  ?  ?LONG TERM GOALS: Target date: 05/04/2021 ?  ?Pt will be able to report pain in AM hours  and when  not working < 2/10 ?Baseline: can be moderate with days off, severe days on ?Goal status: INITIAL ?  ?2.  Pt will be able to work full day with pain no more than 4/10 end of the day  ?Baseline: 8/10-10/10 ?Goal status: INITIAL ?  ?3.  Pt will be able to stand on LLE and perform 15 calf raises  with good clearance ?Baseline: needs UE assist  , min pain with < 8 reps  ?Goal status: INITIAL ?  ?4.  Pt will be I with HEP for LE stretching and strength  ?Baseline: given HEP  ?Goal status: INITIAL ?  ?5.  Pt will be able to improve FOTO score to 63% or better  ?Baseline: 52% ?Goal status: INITIAL ?  ?PLAN: ?PT FREQUENCY: 1-2x/week ?  ?PT DURATION: 6 weeks ?  ?PLANNED INTERVENTIONS: Therapeutic exercises, Therapeutic activity, Neuromuscular re-education, Balance training, Gait training, Patient/Family education, Joint mobilization, Dry Needling, Cryotherapy, Moist heat, Taping, Ultrasound, Ionotophoresis '4mg'$ /ml Dexamethasone, and Manual therapy ?  ?PLAN FOR NEXT SESSION: check HEP , eccentric achilles. SLS , try iontopatch? Tape .  How was ionto ? Massage session Wed. PM ?  ? ? ?Louie Meaders, PT ?04/21/2021, 8:07 AM ? ?  Raeford Razor, PT ?04/21/21 8:07 AM ?Phone: [phone] ?Fax: [phone]  ?

## 2021-04-24 ENCOUNTER — Ambulatory Visit: Payer: BC Managed Care – PPO | Admitting: Physical Therapy

## 2021-04-27 NOTE — Therapy (Addendum)
?OUTPATIENT PHYSICAL THERAPY TREATMENT NOTE ?ADDENDED DISCHARGE ? ? ?Patient Name: Henry Young ?MRN: [id number] ?DOB:11/22/1970, 51 y.o., male ?Today's Date: 04/28/2021 ? ?PCP: Adkins, Mary Katherine, PA-C ?REFERRING PROVIDER: Sikora, Rebecca, MD ? ? PT End of Session - 04/28/21 1109   ? ? Visit Number 3   ? Number of Visits 12   ? Date for PT Re-Evaluation 05/04/21   ? PT Start Time 1106   ? PT Stop Time 1145   ? PT Time Calculation (min) 39 min   ? Activity Tolerance Patient tolerated treatment well   ? Behavior During Therapy WFL for tasks assessed/performed   ? ?  ?  ? ?  ? ? ? ?Past Medical History:  ?Diagnosis Date  ? Diabetes mellitus without complication ([facility])   ? pre diabetes  ? Hyperlipidemia   ? Hypertension   ? Neuromuscular disorder ([facility])   ? sciatica  ? Pre-diabetes   ? ?Past Surgical History:  ?Procedure Laterality Date  ? TRANSFORAMINAL LUMBAR INTERBODY FUSION (TLIF) WITH PEDICLE SCREW FIXATION 1 LEVEL Left 04/21/2020  ? Procedure: LEFT-SIDED LUMBAR 4 - LUMBAR 5 TRANSFORAMINAL LUMBAR INTERBODY FUSION WITH INSTRUMENTATION AND ALLOGRAFT;  Surgeon: Dumonski, Mark, MD;  Location: [facility];  Service: Orthopedics;  Laterality: Left;  ? WISDOM TOOTH EXTRACTION    ? ?Patient Active Problem List  ? Diagnosis Date Noted  ? Radiculopathy 04/21/2020  ? Insomnia 01/05/2020  ? Nonintractable headache 02/02/2019  ? 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 Chronic left shoulder pain 03/24/2018  ? Dyslipidemia 02/11/2018  ? Prediabetes 02/11/2018  ? Essential hypertension 02/06/2018  ? ? ?REFERRING DIAG: M76.62 (ICD-10-CM) - Achilles tendinitis of left lower extremity  ? ?THERAPY DIAG:  ?Achilles tendinitis of left lower extremity ? ?PERTINENT HISTORY: lumbar pain  ? ?PRECAUTIONS: none  ? ?SUBJECTIVE: I have been doing my exercises.  Its getting better.  I have a big bill from the insurance co.  I just got back from primary MD and they are going to give me a referral for my back.  ? ?PAIN:  ?Are you having pain? Yes: NPRS scale:  2/10 ?Pain location: L achilles  ?Pain description: tight  ?Aggravating factors: walking, standing  ?Relieving factors: old shoes made it feel better. Stretches.  ? ? ?DIAGNOSTIC FINDINGS: recent not available from [facility]   ?  ?PATIENT SURVEYS:  ?LEFS NT ?FOTO 52%, 65% on 04/28/21 ?  ?COGNITION: ?          Overall cognitive status: Within functional limits for tasks assessed               ?           ?SENSATION: ?WFL ?  ?POSTURE:  ?Good arches , maintained with squat ?Wide stance, hips ER  ?  ?PALPATION: ?Tender, painful at calcaneus L ankle , min tenderness along Achilles and into calf ?  ?LE ROM: ?  ?Active ROM Right ?03/23/2021 Left ?03/23/2021  ?Hip flexion      ?Hip extension      ?Hip abduction      ?Hip adduction      ?Hip internal rotation      ?Hip external rotation      ?Knee flexion      ?Knee extension      ?Ankle dorsiflexion 15 10  ?Ankle plantarflexion 40 40  ?Ankle inversion 40 30  ?Ankle eversion 15 10  ? (Blank rows = not tested) ?  ?LE MMT: ?  ?MMT Right ?03/23/2021 Left ?03/23/2021  ?Hip   flexion      ?Hip extension      ?Hip abduction      ?Hip adduction      ?Hip internal rotation      ?Hip external rotation      ?Knee flexion 5/5 5/5  ?Knee extension 5/5 4+/5  ?Ankle dorsiflexion 5/5 5/5  ?Ankle plantarflexion 4/5 4/5  ?Ankle inversion   5/5  ?Ankle eversion   5/5  ? (Blank rows = not tested) ?  ?FUNCTIONAL TESTS:  ?SLS  about 20 sec bilateral, L ankle more effort needed to maintain  ?Poor clearance with single leg heel raise ?Squat WFL with decent arch maintained  ?  ?GAIT: ?Distance walked: 150 ?Assistive device utilized: None ?Level of assistance: Complete Independence ?Comments: NA ?  ?  ?  ?TODAY'S TREATMENT: ?PT eval, HEP demo and performed, POC, options to treatment and prognosis, anatomy of Achilles and foot, relationship to plantar fasciitis.  ?  ? [facility] Adult PT Treatment:                                                DATE: 04/24/21 ?Therapeutic Exercise: ?Elliptical level 3-8 ramp ,  level 2 resistance  ?Wall stretch for gastroc/soleus   ?Heel raises off step concentric and eccentric, double and single leg uses UE for balance  ?Leg Press double leg 3 plates   x20  ?Leg Press single leg 2 plates 2 x 10 , more fatigue on LLE  ?Leg press calf raise  x 20 2 plates  ?Hamstring stretch 30 sec  x 3 , calf stretch  ? ?Modalities: ?Ionto patch 6 hour patch 1 cc to L achilles with tape to secure it  ? ? ?[facility] Adult PT Treatment:                                                DATE: 04/05/21 ?Therapeutic Exercise: ?Recumbent bike 5 min L 3 ?Slantboard 60 sec x 3  ?Standing SLS 30 sec  ?Single leg heel raise x 15  ?Narrow stance , toe in, toes out x 15 each  ?Hamstring LLE x 30 x 3 ?Ankle circles with black band limited coordination  ?Eccentric heel raise off step x 15  ?Dead lift x 10 2 sets ( 15 kb, 25 kb)  ? ?Manual Therapy: ?IASTM L achilles , gastrocnemius and heel, manual soft tissue mobilization.  ? ?Modalities: ?Ionto patch 6 hour patch 1 cc to L achilles with tape to secure it  ? ?PATIENT EDUCATION:  ?Education details: see above. Back ex, ionto, HEP  ?Person educated: Patient ?Education method: Explanation, Demonstration, and Handouts ?Education comprehension: verbalized understanding and needs further education ?  ?  ?HOME EXERCISE PROGRAM: ?Access Code: [email or web address] ?URL: [email or web address] ?Date: 03/23/2021 ?Prepared by: Jennifer Paa ?  ? Exercises ?Supine Hamstring Stretch with Strap - 1 x daily - 7 x weekly - 1 sets - 5 reps - 30 hold ?Standing Gastroc Stretch on Step - 1 x daily - 7 x weekly - 2 sets - 10 reps - 30 hold ?Standing Gastroc Stretch - 2 x daily - 7 x weekly - 1 sets - 5 reps - 30 hold ?Standing Heel Raises - 1 x daily - 7 x   weekly - 3 sets - 10 reps - 3 hold ?Standing Single Leg Heel Raise - 1 x daily - 7 x weekly - 3 sets - 10 reps - 3 hold ?  ?  ?ASSESSMENT: ?  ?CLINICAL IMPRESSION: ?Patient is greatly improved, with couple of PT visits.  He is not pleased with the  fact that his balance on his acct was so high.  He is going to be HOLD to see if he can goes elsewhere with less out of pocket.  He would like to have a eval for his low back pain as well.  He has another appt here but may cancel based on financial reasons.  ? ?OBJECTIVE IMPAIRMENTS decreased balance, decreased mobility, difficulty walking, decreased strength, increased fascial restrictions, impaired flexibility, and pain.  ?  ?ACTIVITY LIMITATIONS community activity and occupation.  ?  ?PERSONAL FACTORS Profession and 3+ comorbidities: diabetes, lumbar fusion, h/o plantar fasciitis  are also affecting patient's functional outcome.  ?  ?  ?REHAB POTENTIAL: Excellent ?  ?CLINICAL DECISION MAKING: Stable/uncomplicated ?  ?EVALUATION COMPLEXITY: Low ?  ?  ?GOALS: ?Goals reviewed with patient? Yes ?  ?  ?LONG TERM GOALS: Target date: 05/04/2021 ?  ?Pt will be able to report pain in AM hours and when not working < 2/10 ?Baseline: can be moderate with days off, severe days on ?Goal status: 2/10-4/10 ?  ?2.  Pt will be able to work full day with pain no more than 4/10 end of the day  ?Baseline: 8/10-10/10 ?Goal status: 2/10-4/10 ?  ?3.  Pt will be able to stand on LLE and perform 15 calf raises  with good clearance ?Baseline: needs UE assist  , min pain with < 8 reps  ?Goal status: INITIAL ?  ?4.  Pt will be I with HEP for LE stretching and strength  ?Baseline: given HEP  ?Goal status: up to date  ?  ?5.  Pt will be able to improve FOTO score to 63% or better  ?Baseline: 52%  ?Goal status: MET 65% ?  ?PLAN: ?PT FREQUENCY: 1-2x/week ?  ?PT DURATION: 6 weeks ?  ?PLANNED INTERVENTIONS: Therapeutic exercises, Therapeutic activity, Neuromuscular re-education, Balance training, Gait training, Patient/Family education, Joint mobilization, Dry Needling, Cryotherapy, Moist heat, Taping, Ultrasound, Ionotophoresis 4mg/ml Dexamethasone, and Manual therapy ?  ?PLAN FOR NEXT SESSION: cont vs DC> Back pain /eval  ? ? ?PAA,JENNIFER,  PT ?04/28/2021, 12:54 PM ? ?  Jennifer Paa, PT ?04/28/21 12:54 PM ?Phone: [phone] ?Fax: [phone]  ? ?PHYSICAL THERAPY DISCHARGE SUMMARY ? ?Visits from Start of Care: 3 ? ?Current functional level re

## 2021-04-28 ENCOUNTER — Ambulatory Visit: Payer: BC Managed Care – PPO | Attending: Podiatry | Admitting: Physical Therapy

## 2021-04-28 ENCOUNTER — Encounter: Payer: Self-pay | Admitting: Physical Therapy

## 2021-04-28 DIAGNOSIS — M7662 Achilles tendinitis, left leg: Secondary | ICD-10-CM | POA: Insufficient documentation

## 2021-05-01 ENCOUNTER — Ambulatory Visit: Payer: BC Managed Care – PPO | Admitting: Physical Therapy

## 2021-05-05 ENCOUNTER — Other Ambulatory Visit: Payer: Self-pay | Admitting: Family Medicine

## 2021-05-12 ENCOUNTER — Ambulatory Visit: Payer: BC Managed Care – PPO | Admitting: Physical Therapy

## 2021-05-15 ENCOUNTER — Other Ambulatory Visit: Payer: Self-pay | Admitting: Family Medicine

## 2021-06-26 ENCOUNTER — Other Ambulatory Visit: Payer: Self-pay | Admitting: Orthopedic Surgery

## 2021-06-26 DIAGNOSIS — M5412 Radiculopathy, cervical region: Secondary | ICD-10-CM

## 2021-07-08 ENCOUNTER — Ambulatory Visit
Admission: RE | Admit: 2021-07-08 | Discharge: 2021-07-08 | Disposition: A | Discharge status: Home / Self Care | Payer: BC Managed Care – PPO | Source: Ambulatory Visit | Attending: Orthopedic Surgery | Admitting: Orthopedic Surgery

## 2021-07-08 DIAGNOSIS — M5412 Radiculopathy, cervical region: Secondary | ICD-10-CM

## 2022-01-26 ENCOUNTER — Telehealth: Payer: Self-pay | Admitting: Podiatry

## 2022-01-26 NOTE — Telephone Encounter (Signed)
Patient left message on voicemail about getting orthotics refurbished.   Called patient back left the price of having orthotics refurbished and also the cost of getting new inserts done.

## 2022-05-01 ENCOUNTER — Other Ambulatory Visit: Payer: Self-pay

## 2022-05-01 ENCOUNTER — Encounter (HOSPITAL_BASED_OUTPATIENT_CLINIC_OR_DEPARTMENT_OTHER): Payer: Self-pay

## 2022-05-01 ENCOUNTER — Emergency Department (HOSPITAL_BASED_OUTPATIENT_CLINIC_OR_DEPARTMENT_OTHER)
Admission: EM | Admit: 2022-05-01 | Discharge: 2022-05-01 | Disposition: A | Discharge status: Home / Self Care | Payer: BC Managed Care – PPO | Attending: Emergency Medicine | Admitting: Emergency Medicine

## 2022-05-01 DIAGNOSIS — Z113 Encounter for screening for infections with a predominantly sexual mode of transmission: Secondary | ICD-10-CM | POA: Insufficient documentation

## 2022-05-01 DIAGNOSIS — E119 Type 2 diabetes mellitus without complications: Secondary | ICD-10-CM | POA: Diagnosis not present

## 2022-05-01 DIAGNOSIS — R300 Dysuria: Secondary | ICD-10-CM | POA: Insufficient documentation

## 2022-05-01 DIAGNOSIS — I10 Essential (primary) hypertension: Secondary | ICD-10-CM | POA: Diagnosis not present

## 2022-05-01 DIAGNOSIS — Z79899 Other long term (current) drug therapy: Secondary | ICD-10-CM | POA: Insufficient documentation

## 2022-05-01 LAB — URINALYSIS, ROUTINE W REFLEX MICROSCOPIC
Bilirubin Urine: NEGATIVE
Glucose, UA: NEGATIVE mg/dL
Hgb urine dipstick: NEGATIVE
Ketones, ur: NEGATIVE mg/dL
Leukocytes,Ua: NEGATIVE
Nitrite: NEGATIVE
Protein, ur: NEGATIVE mg/dL
Specific Gravity, Urine: 1.014 (ref 1.005–1.030)
pH: 5.5 (ref 5.0–8.0)

## 2022-05-01 LAB — CBG MONITORING, ED: Glucose-Capillary: 117 mg/dL — ABNORMAL HIGH (ref 70–99)

## 2022-05-01 LAB — HIV ANTIBODY (ROUTINE TESTING W REFLEX): HIV Screen 4th Generation wRfx: NONREACTIVE

## 2022-05-01 NOTE — ED Triage Notes (Signed)
Patient here POV from Home.  Endorses noting some Dysuria that began a few days ago. Subsided with PO Fluid Intake. Some Tingling still noted.  No fevers. No N/V/D. Darker Urine noted.   NAD noted during Triage. A&Ox4. GCS 15. Ambulatory.

## 2022-05-01 NOTE — Discharge Instructions (Signed)
You have been seen today for your complaint of painful urination. Your lab work was negative for signs of a urinary tract infection.  The rest of your labs will result in approximately 3 days.  You may check your MyChart for these results Follow up with: Your primary care provider as scheduled. Please seek immediate medical care if you develop any of the following symptoms: Your pain is severe and not relieved with medicines. You cannot eat or drink without vomiting. You are confused. You have a rapid heartbeat while resting. You have shaking or chills. You feel extremely weak. At this time there does not appear to be the presence of an emergent medical condition, however there is always the potential for conditions to change. Please read and follow the below instructions.  Do not take your medicine if  develop an itchy rash, swelling in your mouth or lips, or difficulty breathing; call 911 and seek immediate emergency medical attention if this occurs.  You may review your lab tests and imaging results in their entirety on your MyChart account.  Please discuss all results of fully with your primary care provider and other specialist at your follow-up visit.  Note: Portions of this text may have been transcribed using voice recognition software. Every effort was made to ensure accuracy; however, inadvertent computerized transcription errors may still be present.

## 2022-05-01 NOTE — ED Provider Notes (Signed)
[facility] AT [facility] Provider Note   CSN: [id number] Arrival date & time: 05/01/22  1858     History  Chief Complaint  Patient presents with   Dysuria    Henry Young is a 52 y.o. male.  With a history of diabetes and hypertension who presents to the ED for evaluation of dysuria.  He states 2 days ago he noticed a stinging sensation when he urinated.  He then began drinking more water and states that his symptoms have resolved.  He is concerned about his symptoms because he has diabetes.  He currently denies any symptoms including dysuria, frequency, urgency, testicle pain, penile pain, rashes, penile discharge, fevers, chills, nausea, vomiting, abdominal pain.  He is sexually active with at least 4 male partners and engages in unprotected intercourse.  He does not know of any STDs that he has been exposed who.   Dysuria Presenting symptoms: dysuria        Home Medications Prior to Admission medications   Medication Sig Start Date End Date Taking? Authorizing Provider  atorvastatin (LIPITOR) 40 MG tablet TAKE 1 TABLET BY MOUTH EVERY DAY Patient taking differently: Take 40 mg by mouth daily. 10/13/19   Ardith Dark, MD  empagliflozin (JARDIANCE) 10 MG TABS tablet TAKE 10 MG BY MOUTH DAILY BEFORE BREAKFAST. Patient taking differently: Take 10 mg by mouth daily. TAKE 10 MG BY MOUTH DAILY BEFORE BREAKFAST. 03/28/20   Ardith Dark, MD  hydrochlorothiazide (HYDRODIURIL) 25 MG tablet TAKE 1 TABLET (25 MG TOTAL) BY MOUTH DAILY. Patient not taking: Reported on 03/23/2021 05/23/20   Ardith Dark, MD  losartan-hydrochlorothiazide (HYZAAR) 100-25 MG tablet TAKE 1 TABLET BY MOUTH EVERY DAY Patient taking differently: Take 1 tablet by mouth daily. 02/15/20   Ardith Dark, MD  meloxicam (MOBIC) 15 MG tablet Take 1 tablet (15 mg total) by mouth daily. Patient not taking: Reported on 03/23/2021 12/30/20   Edwin Cap, DPM  methocarbamol  (ROBAXIN) 500 MG tablet Take 1 tablet (500 mg total) by mouth every 6 (six) hours as needed for muscle spasms. Patient not taking: Reported on 03/23/2021 04/22/20   Estill Bamberg, MD  methylPREDNISolone (MEDROL DOSEPAK) 4 MG TBPK tablet 6 day dose pack - take as directed Patient not taking: Reported on 03/23/2021 12/30/20   Edwin Cap, DPM  traZODone (DESYREL) 50 MG tablet TAKE 0.5-1 TABLETS BY MOUTH AT BEDTIME AS NEEDED FOR SLEEP. Patient not taking: Reported on 04/07/2020 03/28/20   Ardith Dark, MD      Allergies    Patient has no known allergies.    Review of Systems   Review of Systems  Genitourinary:  Positive for dysuria.  All other systems reviewed and are negative.   Physical Exam Updated Vital Signs BP (!) 155/105 (BP Location: Right Arm)   Pulse 95   Temp 98.2 F (36.8 C) (Oral)   Resp 18   Ht  (1.854 m)   Wt 106.6 kg   SpO2 100%   BMI 31.00 kg/m  Physical Exam Vitals and nursing note reviewed.  Constitutional:      General: He is not in acute distress.    Appearance: Normal appearance. He is normal weight. He is not ill-appearing.     Comments: Resting comfortably in chair  HENT:     Head: Normocephalic and atraumatic.  Cardiovascular:     Rate and Rhythm: Normal rate.  Pulmonary:     Effort: Pulmonary effort is normal.  No respiratory distress.  Abdominal:     General: Abdomen is flat.     Tenderness: There is no abdominal tenderness.  Musculoskeletal:        General: Normal range of motion.     Cervical back: Neck supple.     Right lower leg: No edema.     Left lower leg: No edema.  Skin:    General: Skin is warm and dry.  Neurological:     Mental Status: He is alert and oriented to person, place, and time.  Psychiatric:        Mood and Affect: Mood normal.        Behavior: Behavior normal.     ED Results / Procedures / Treatments   Labs (all labs ordered are listed, but only abnormal results are displayed) Labs Reviewed  CBG  MONITORING, ED - Abnormal; Notable for the following components:      Result Value   Glucose-Capillary 117 (*)    All other components within normal limits  URINALYSIS, ROUTINE W REFLEX MICROSCOPIC  RPR  HIV ANTIBODY (ROUTINE TESTING W REFLEX)  GC/CHLAMYDIA PROBE AMP ([place]) NOT AT [facility]    EKG None  Radiology No results found.  Procedures Procedures    Medications Ordered in ED Medications - No data to display  ED Course/ Medical Decision Making/ A&P                             Medical Decision Making Amount and/or Complexity of Data Reviewed Labs: ordered.  This patient presents to the ED for concern of dysuria, this involves an extensive number of treatment options, and is a complaint that carries with it a high risk of complications and morbidity.  The differential diagnosis includes cystitis, urethritis, STI  Co morbidities that complicate the patient evaluation   hypertension, diabetes  My initial workup includes urinalysis, STI panel  Additional history obtained from: Nursing notes from this visit.  I ordered, reviewed and interpreted labs which include: Urinalysis, STI panel.  Urinalysis normal without signs of infection  Afebrile, hemodynamically stable.  52 year old male presenting to the ED for evaluation of dysuria.  This was 2 days ago.  He has since been symptom-free after drinking more water.  He is not concerned for STIs, however has numerous male partners and engages in unprotected sex.  He does agree to STI testing in the ED.  He declines GU exam.  His urinalysis was without signs of infection.  STI panel was ordered and patient was encouraged to follow-up on his results on MyChart.  He was encouraged to seek treatment if he was positive.  He states he has an appointment with his primary care provider on Monday and will follow-up at that time.  He is otherwise well-appearing on exam.  No indication for empiric treatment today.  He was given return  precautions.  Stable at discharge.  At this time there does not appear to be any evidence of an acute emergency medical condition and the patient appears stable for discharge with appropriate outpatient follow up. Diagnosis was discussed with patient who verbalizes understanding of care plan and is agreeable to discharge. I have discussed return precautions with patient who verbalizes understanding. Patient encouraged to follow-up with their PCP within 1 week. All questions answered.  Note: Portions of this report may have been transcribed using voice recognition software. Every effort was made to ensure accuracy; however, inadvertent computerized transcription errors  may still be present.         Final Clinical Impression(s) / ED Diagnoses Final diagnoses:  Dysuria  Screen for STD (sexually transmitted disease)    Rx / DC Orders ED Discharge Orders     None         Mora Bellman 05/01/22 2058    Ernie Avena, MD 05/01/22 [phone]

## 2022-05-02 LAB — GC/CHLAMYDIA PROBE AMP (~~LOC~~) NOT AT ARMC
Chlamydia: NEGATIVE
Comment: NEGATIVE
Comment: NORMAL
Neisseria Gonorrhea: NEGATIVE

## 2022-05-02 LAB — RPR: RPR Ser Ql: NONREACTIVE

## 2024-01-15 IMAGING — CR DG LUMBAR SPINE COMPLETE 4+V
5 series · 5 of 5 positions shown · non-contrast
Comparison: 04/21/2020 lumbar spine radiographs

CLINICAL DATA: Fall, low back pain

EXAM:
LUMBAR SPINE - COMPLETE 4+ VIEW

[t lumbar spine ap]
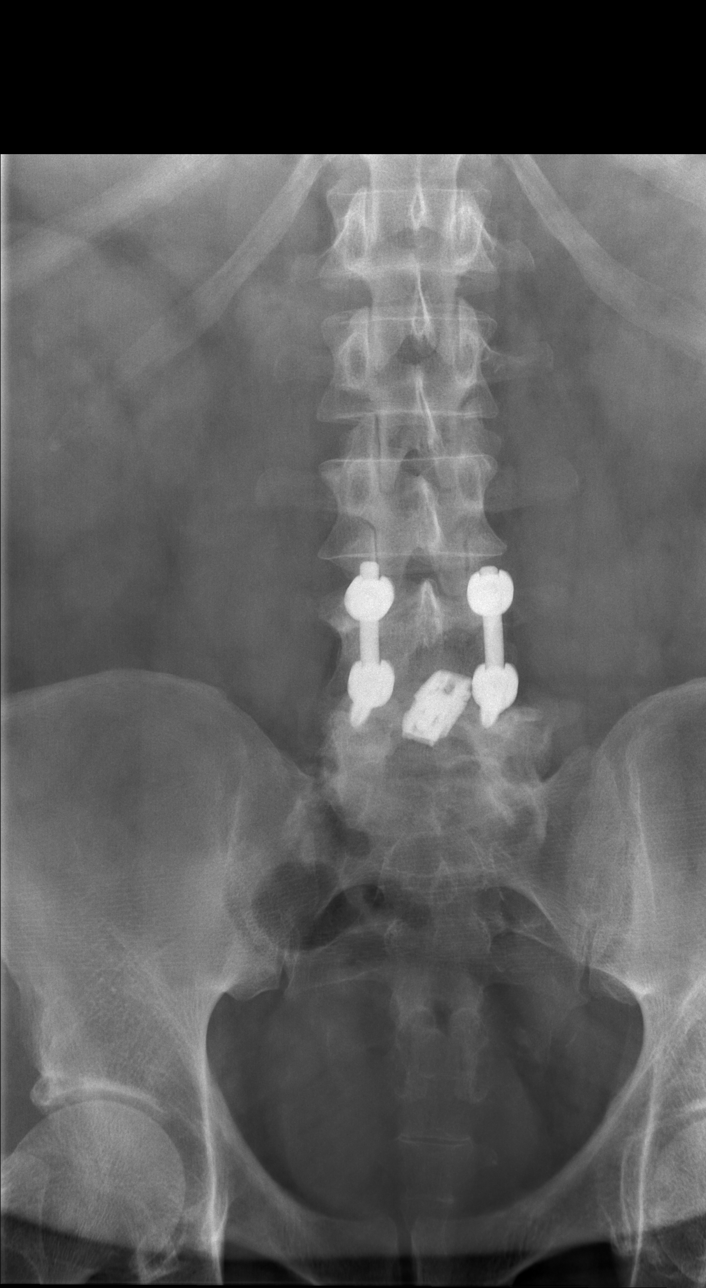

[t lumbar spine obl (1 of 2)]
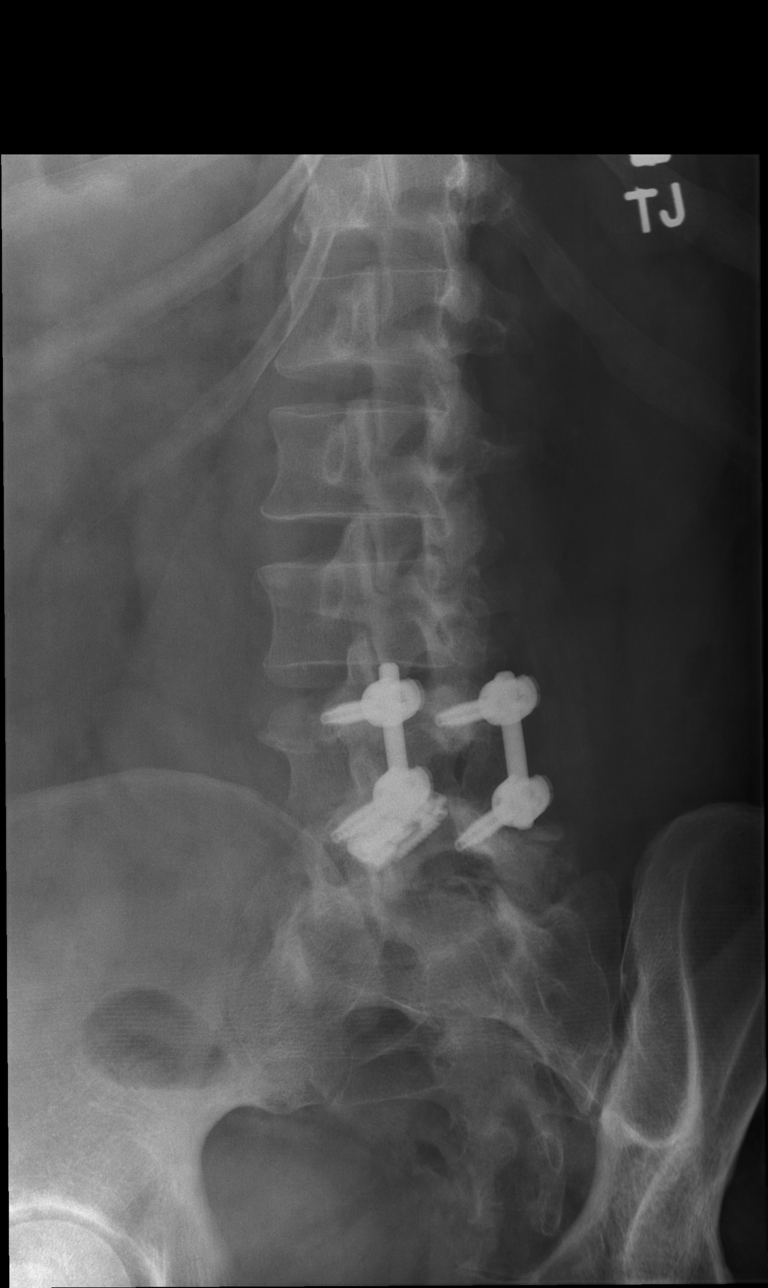

[t lumbar spine obl (2 of 2)]
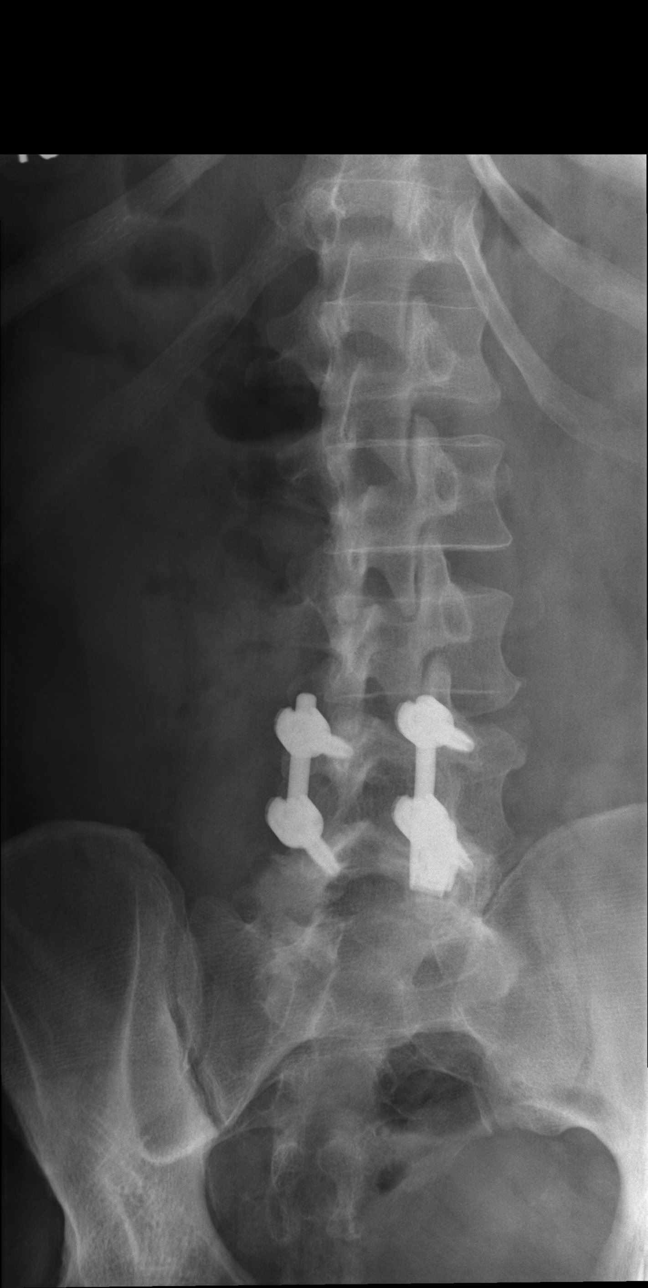

[t lumbar spine lat]
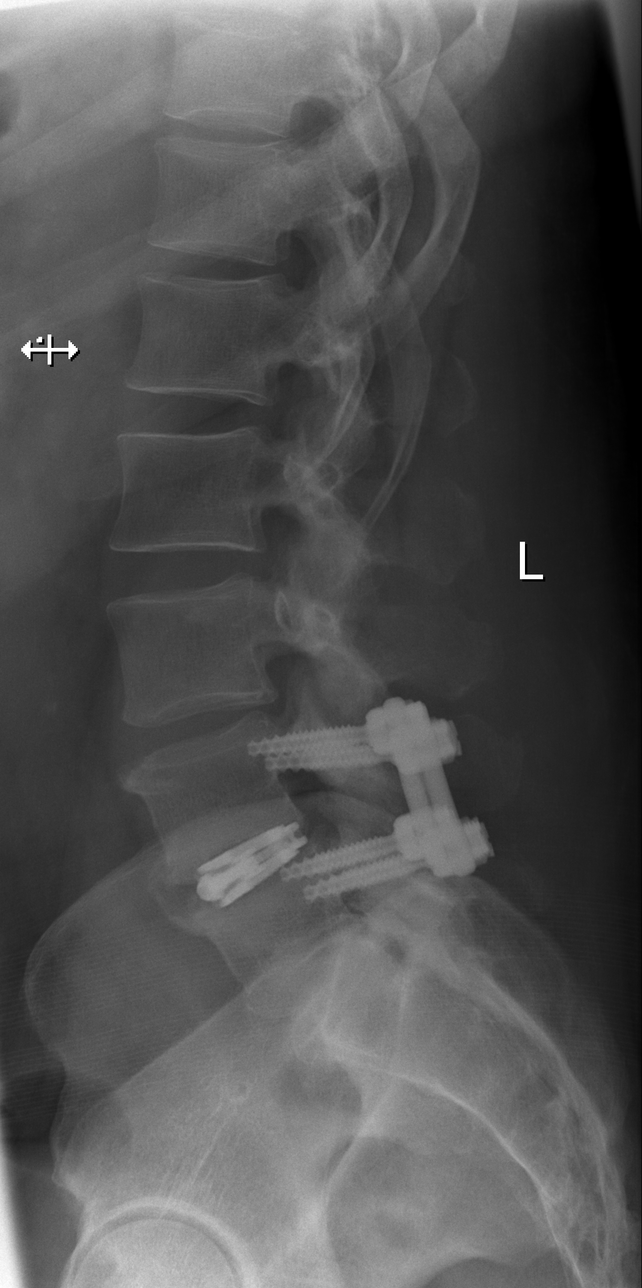

[t lumbar l-5 s-1 spot]
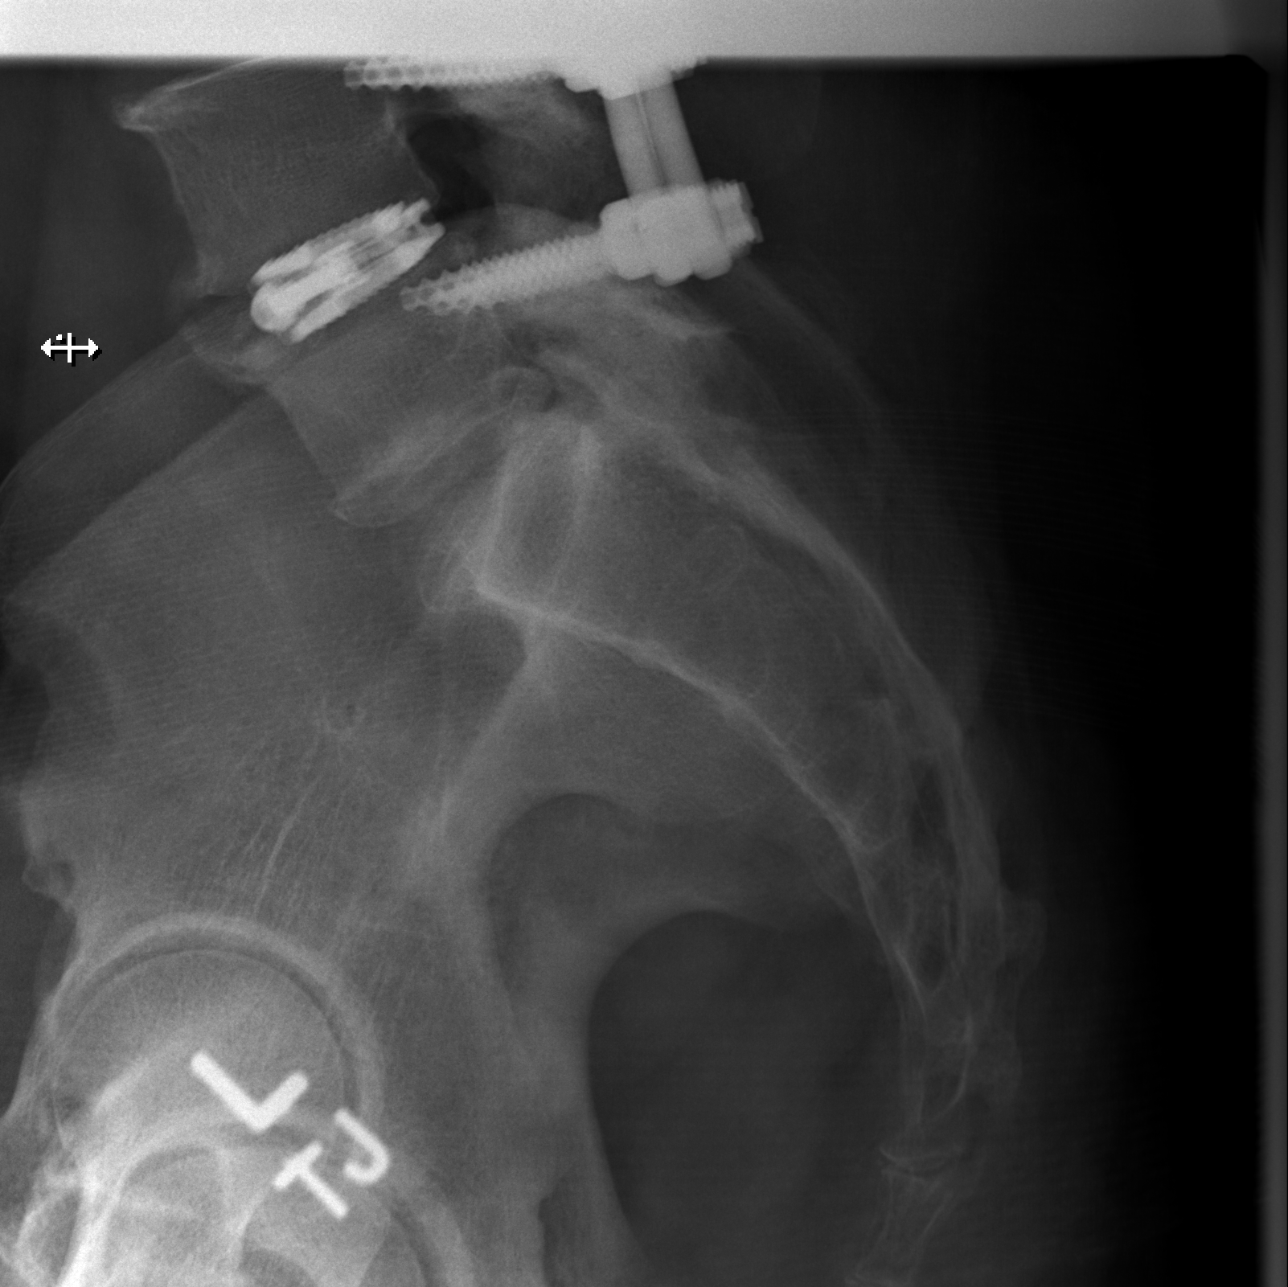

[5 of 5 positions shown; findings below may reference images not displayed]

FINDINGS: This report assumes 5 non rib-bearing lumbar vertebrae. Status post
bilateral posterior spinal fusion L4-5 with no evidence of hardware
fracture or loosening. Bone cage in place and L4-5 disc space.

Lumbar vertebral body heights are preserved, with no fracture.

Mild lumbar degenerative disc disease at L3-4, similar. Minimal 3 mm
retrolisthesis at L3-4, unchanged. No aggressive appearing focal
osseous lesions.
IMPRESSION: 1. No lumbar spine fracture or acute malalignment.
2. Status post bilateral posterior spinal fusion L4-5, with no
evidence of hardware complication.
